# Patient Record
Sex: Male | Born: 1960 | Hispanic: Yes | Marital: Married | Smoking: Never smoker
Health system: Southern US, Community
[De-identification: ages and names within clinical notes are randomized; demographics above are authoritative.]

## PROBLEM LIST (undated history)

## (undated) ENCOUNTER — Ambulatory Visit: Admission: EM | Payer: BC Managed Care – PPO

## (undated) DIAGNOSIS — M1990 Unspecified osteoarthritis, unspecified site: Secondary | ICD-10-CM

## (undated) DIAGNOSIS — R197 Diarrhea, unspecified: Secondary | ICD-10-CM

## (undated) DIAGNOSIS — R109 Unspecified abdominal pain: Secondary | ICD-10-CM

## (undated) DIAGNOSIS — R1110 Vomiting, unspecified: Secondary | ICD-10-CM

## (undated) DIAGNOSIS — E039 Hypothyroidism, unspecified: Secondary | ICD-10-CM

## (undated) DIAGNOSIS — M25562 Pain in left knee: Secondary | ICD-10-CM

## (undated) DIAGNOSIS — K625 Hemorrhage of anus and rectum: Secondary | ICD-10-CM

## (undated) HISTORY — DX: Diarrhea, unspecified: R19.7

## (undated) HISTORY — DX: Pain in left knee: M25.562

## (undated) HISTORY — DX: Unspecified osteoarthritis, unspecified site: M19.90

## (undated) HISTORY — PX: KNEE ARTHROSCOPY: SUR90

## (undated) HISTORY — DX: Unspecified abdominal pain: R10.9

## (undated) HISTORY — DX: Hypothyroidism, unspecified: E03.9

## (undated) HISTORY — DX: Vomiting, unspecified: R11.10

## (undated) HISTORY — PX: GANGLION CYST EXCISION: SHX1691

## (undated) HISTORY — DX: Hemorrhage of anus and rectum: K62.5

---

## 2002-05-24 ENCOUNTER — Ambulatory Visit (HOSPITAL_COMMUNITY): Admission: RE | Admit: 2002-05-24 | Discharge: 2002-05-24 | Payer: Self-pay | Admitting: Otolaryngology

## 2002-05-24 ENCOUNTER — Encounter: Payer: Self-pay | Admitting: Otolaryngology

## 2003-03-02 ENCOUNTER — Ambulatory Visit (HOSPITAL_COMMUNITY): Admission: RE | Admit: 2003-03-02 | Discharge: 2003-03-02 | Payer: Self-pay | Admitting: General Surgery

## 2005-03-28 ENCOUNTER — Emergency Department (HOSPITAL_COMMUNITY): Admission: EM | Admit: 2005-03-28 | Discharge: 2005-03-28 | Payer: Self-pay | Admitting: Emergency Medicine

## 2009-07-01 ENCOUNTER — Inpatient Hospital Stay (HOSPITAL_COMMUNITY): Admission: AD | Admit: 2009-07-01 | Discharge: 2009-07-05 | Payer: Self-pay | Admitting: Family Medicine

## 2009-07-01 ENCOUNTER — Ambulatory Visit: Payer: Self-pay | Admitting: Orthopedic Surgery

## 2009-07-03 ENCOUNTER — Encounter: Payer: Self-pay | Admitting: Orthopedic Surgery

## 2009-07-04 ENCOUNTER — Encounter: Payer: Self-pay | Admitting: Orthopedic Surgery

## 2009-07-09 ENCOUNTER — Ambulatory Visit: Payer: Self-pay | Admitting: Orthopedic Surgery

## 2009-07-09 DIAGNOSIS — M009 Pyogenic arthritis, unspecified: Secondary | ICD-10-CM | POA: Insufficient documentation

## 2009-07-09 DIAGNOSIS — M25469 Effusion, unspecified knee: Secondary | ICD-10-CM

## 2009-07-10 ENCOUNTER — Encounter: Payer: Self-pay | Admitting: Orthopedic Surgery

## 2009-07-10 LAB — CONVERTED CEMR LAB: Monocyte/Macrophage: 16 % — ABNORMAL LOW (ref 50–90)

## 2009-07-11 ENCOUNTER — Ambulatory Visit: Payer: Self-pay | Admitting: Orthopedic Surgery

## 2009-07-16 ENCOUNTER — Encounter (HOSPITAL_COMMUNITY): Admission: RE | Admit: 2009-07-16 | Discharge: 2009-08-15 | Payer: Self-pay | Admitting: Orthopedic Surgery

## 2009-07-16 ENCOUNTER — Encounter: Payer: Self-pay | Admitting: Orthopedic Surgery

## 2009-07-25 ENCOUNTER — Ambulatory Visit: Payer: Self-pay | Admitting: Orthopedic Surgery

## 2009-08-09 ENCOUNTER — Encounter: Payer: Self-pay | Admitting: Orthopedic Surgery

## 2009-08-16 ENCOUNTER — Encounter (HOSPITAL_COMMUNITY): Admission: RE | Admit: 2009-08-16 | Discharge: 2009-09-15 | Payer: Self-pay | Admitting: Orthopedic Surgery

## 2009-08-28 ENCOUNTER — Encounter: Payer: Self-pay | Admitting: Orthopedic Surgery

## 2010-02-25 NOTE — Miscellaneous (Signed)
Summary: PT Discharge summary  PT Discharge summary   Imported By: Jacklynn Ganong 08/28/2009 13:21:58  _____________________________________________________________________  External Attachment:    Type:   Image     Comment:   External Document

## 2010-02-25 NOTE — Assessment & Plan Note (Signed)
Summary: 2 WK RE-CK LT KNEE/EQUITY INS/CAF   Visit Type:  Follow-up Primary Provider:  Dr. Sudie Bailey  CC:  left knee pain.  History of Present Illness: I saw Frank Carr in the office today for a 2 week  followup visit.  He is a 50 years old man with the complaint of:  left knee  Follow up after PT.  DOI 07/01/09 left knee.  better after therapy and aspiration after surgery   aspiarate negative for infection   C/O some night pain   He does not have full flexion of his knee quite yet he is about 95-100.  He has almost full extension.  He has no joint effusion no pain or tenderness at the knee area  Recommended continued therapy for 2 more weeks then he can stop  He also should continue to take his antibiotics until it is finished      Impression & Recommendations:  Problem # 1:  JOINT EFFUSION, LEFT KNEE (ICD-719.06)  Patient Instructions: 1)  take pain medictaion as needed f/u as needed  2)  finish therapy   Appended Document: 2 WK RE-CK LT KNEE/EQUITY INS/CAF

## 2010-02-25 NOTE — Letter (Signed)
Summary: Out of Work  [facility]  [address]  [place], [place] [address]   Phone: [phone]  Fax: [phone]    July 11, 2009   Employee:  Frank Carr [facility]    To Whom It May Concern:   For Medical reasons, please excuse the above named employee from work for the following dates:  Start:   June 29, 2009  End:   July 26, 2009  or until further notice  (Next scheduled appointment 07/25/09)    If you need additional information, please feel free to contact our office.         Sincerely,    Terrance Mass, MD

## 2010-02-25 NOTE — Assessment & Plan Note (Signed)
Summary: RE-CK LT KNEE/POST OP ARTHROSC/UFCW/CAF   Visit Type:  Follow-up Primary Provider:  Dr. Sudie Bailey  CC:  post op left knee.  History of Present Illness: followup visit status post arthroscopic lavage and limited debridement for present septic arthritis after a booster cone punctured the patient's LEFT knee. DOI 07/01/09 left knee.  he is currently on Augmentin 500 mg t.i.d. and I aspirated his knee post surgery because of pain  His Gram stain showed no organisms were 29,000 white cells in the fluid no crystals were seen.  Today is recheck after starting Augmentin 500mg  every 8 hrs and fluid aspiration sent for culture last visit, 2 days ago  a culture was sent and I asked them to grow her for 14 days  He said he has not taken pain pills since yesterday afternoon and his knee is much better    Physical Exam  Additional Exam:  He still using crutches to ambulate still placing minimal pressure on his LEFT knee but he did get 45 of flexion today and he has about a 5 extensor lag   Impression & Recommendations:  Problem # 1:  JOINT EFFUSION, LEFT KNEE (ICD-719.06) Assessment Improved  Orders: Physical Therapy Referral (PT) Post-Op Check ([address])  Problem # 2:  PYOGENIC ARTHRITIS, 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

## 2010-02-25 NOTE — Assessment & Plan Note (Signed)
Summary: HOSP FOL/UP/POST OP SURG 07/04/09/LT KNEE ARTH/UFCW/BSF   Visit Type:  Follow-up Primary Provider:  Dr. Sudie Bailey  CC:  left knee pain.  History of Present Illness: I saw Frank Carr in the office today for a followup visit.  He is a 50 years old man with the complaint of:  fu from hospital.  DOI 07/01/09 left knee.  Arthrosopic lavage on 07/03/09 left knee, sepsis dx.  Oral ATBS, none were given from hospital discharge  Norco 10/650 was taken, no relief, PCP Dr. Sudie Bailey called in Percocet today has not picked up yet.  No injury since surgery.  Pain since Day after surgery.  Dr. Sudie Bailey called me today and advised me that the patient was having severe pain and swelling of his LEFT knee and that his pain medications Norco 10 mg was not working  We decided to bring him in for evaluation  He does have a tense effusion in the LEFT knee and tenderness in the suprapatellar pouch.  This is the area noted at surgery to have a weblike fibrinous exudate which was resected.  We attempted aspiration of the knee were able to obtain proximally 50-60 cc of amber fluid which was nonpurulent this was sent for culture and for cell count.  The patient was not on any oral antibiotics and was started on Augmentin 500 mg q.8 hours until he is advised to stop.  I will followup with him on Thursday for reevaluation.  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 You may experience increased pain at the injection site. Apply ice pack to the area for 20 minutes every 2 hours and take 2 xtra strength tylenol every 8 hours. This increased pain will usually resolve in 24 hours. The injection will take effect in 3-10 days.   3)  check labs

## 2010-02-25 NOTE — Miscellaneous (Signed)
Summary: PT Progress note  PT Progress note   Imported By: Jacklynn Ganong 08/15/2009 10:25:25  _____________________________________________________________________  External Attachment:    Type:   Image     Comment:   External Document

## 2010-02-25 NOTE — Op Note (Signed)
Summary: Op Rpt  LT knee arthroscopy w/lim debridement  Op Rpt  LT knee arthroscopy w/lim debridement   Imported By: Cammie Sickle 07/09/2009 16:51:45  _____________________________________________________________________  External Attachment:    Type:   Image     Comment:   External Document

## 2010-02-25 NOTE — Miscellaneous (Signed)
Summary: PT clinical evaluation  PT clinical evaluation   Imported By: Jacklynn Ganong 07/22/2009 15:07:53  _____________________________________________________________________  External Attachment:    Type:   Image     Comment:   External Document

## 2010-04-14 LAB — BASIC METABOLIC PANEL
BUN: 10 mg/dL (ref 6–23)
CO2: 29 mEq/L (ref 19–32)
CO2: 31 mEq/L (ref 19–32)
Calcium: 8.5 mg/dL (ref 8.4–10.5)
Chloride: 104 mEq/L (ref 96–112)
Chloride: 107 mEq/L (ref 96–112)
Creatinine, Ser: 0.74 mg/dL (ref 0.4–1.5)
Creatinine, Ser: 0.84 mg/dL (ref 0.4–1.5)
GFR calc Af Amer: >60 mL/min (ref >60)
GFR calc Af Amer: >60 mL/min (ref >60)
GFR calc non Af Amer: >60 mL/min (ref >60)
Glucose, Bld: 116 mg/dL — ABNORMAL HIGH (ref 70–99)
Glucose, Bld: 124 mg/dL — ABNORMAL HIGH (ref 70–99)
Potassium: 3.4 mEq/L — ABNORMAL LOW (ref 3.5–5.1)
Potassium: 3.7 mEq/L (ref 3.5–5.1)
Sodium: 141 mEq/L (ref 135–145)
Sodium: 141 mEq/L (ref 135–145)

## 2010-04-14 LAB — CBC
HCT: 38.5 % — ABNORMAL LOW (ref 39.0–52.0)
HCT: 38.7 % — ABNORMAL LOW (ref 39.0–52.0)
HCT: 40.8 % (ref 39.0–52.0)
Hemoglobin: 12.9 g/dL — ABNORMAL LOW (ref 13.0–17.0)
Hemoglobin: 13.3 g/dL (ref 13.0–17.0)
Hemoglobin: 14.4 g/dL (ref 13.0–17.0)
MCHC: 34.5 g/dL (ref 30.0–36.0)
MCHC: 35.2 g/dL (ref 30.0–36.0)
MCHC: 35.4 g/dL (ref 30.0–36.0)
MCV: 89.1 fL (ref 78.0–100.0)
MCV: 90.5 fL (ref 78.0–100.0)
MCV: 90.5 fL (ref 78.0–100.0)
Platelets: 294 10*3/uL (ref 150–400)
RBC: 4.05 MIL/uL — ABNORMAL LOW (ref 4.22–5.81)
RBC: 4.26 MIL/uL (ref 4.22–5.81)
RBC: 4.58 MIL/uL (ref 4.22–5.81)
RDW: 13.1 % (ref 11.5–15.5)
RDW: 13.3 % (ref 11.5–15.5)
WBC: 12 10*3/uL — ABNORMAL HIGH (ref 4.0–10.5)
WBC: 8.5 10*3/uL (ref 4.0–10.5)

## 2010-04-14 LAB — WOUND CULTURE: Culture: NO GROWTH

## 2010-04-14 LAB — DIFFERENTIAL
Basophils Absolute: 0 10*3/uL (ref 0.0–0.1)
Basophils Absolute: 0.1 10*3/uL (ref 0.0–0.1)
Basophils Relative: 1 % (ref 0–1)
Eosinophils Absolute: 0.2 10*3/uL (ref 0.0–0.7)
Eosinophils Absolute: 0.6 10*3/uL (ref 0.0–0.7)
Eosinophils Relative: 2 % (ref 0–5)
Eosinophils Relative: 7 % — ABNORMAL HIGH (ref 0–5)
Eosinophils Relative: 9 % — ABNORMAL HIGH (ref 0–5)
Lymphocytes Relative: 17 % (ref 12–46)
Lymphocytes Relative: 9 % — ABNORMAL LOW (ref 12–46)
Lymphs Abs: 1 10*3/uL (ref 0.7–4.0)
Lymphs Abs: 1.2 10*3/uL (ref 0.7–4.0)
Monocytes Absolute: 0.4 10*3/uL (ref 0.1–1.0)
Monocytes Absolute: 0.6 10*3/uL (ref 0.1–1.0)
Monocytes Relative: 6 % (ref 3–12)
Monocytes Relative: 7 % (ref 3–12)
Neutro Abs: 4.4 10*3/uL (ref 1.7–7.7)
Neutro Abs: 6.2 10*3/uL (ref 1.7–7.7)
Neutrophils Relative %: 69 % (ref 43–77)
Neutrophils Relative %: 73 % (ref 43–77)
Neutrophils Relative %: 83 % — ABNORMAL HIGH (ref 43–77)

## 2010-04-14 LAB — URINE MICROSCOPIC-ADD ON

## 2010-04-14 LAB — COMPREHENSIVE METABOLIC PANEL
CO2: 29 mEq/L (ref 19–32)
Calcium: 8.9 mg/dL (ref 8.4–10.5)
Chloride: 99 mEq/L (ref 96–112)
Creatinine, Ser: 0.87 mg/dL (ref 0.4–1.5)
GFR calc non Af Amer: >60 mL/min (ref >60)
Glucose, Bld: 108 mg/dL — ABNORMAL HIGH (ref 70–99)
Total Bilirubin: 1.5 mg/dL — ABNORMAL HIGH (ref 0.3–1.2)

## 2010-04-14 LAB — URINALYSIS, ROUTINE W REFLEX MICROSCOPIC
Bilirubin Urine: NEGATIVE
Glucose, UA: NEGATIVE mg/dL
Specific Gravity, Urine: >1.03 — ABNORMAL HIGH (ref 1.005–1.030)
Urobilinogen, UA: 1 mg/dL (ref 0.0–1.0)
pH: 6 (ref 5.0–8.0)

## 2010-04-14 LAB — ANAEROBIC CULTURE

## 2010-06-09 DIAGNOSIS — R197 Diarrhea, unspecified: Secondary | ICD-10-CM

## 2010-06-09 DIAGNOSIS — R109 Unspecified abdominal pain: Secondary | ICD-10-CM

## 2010-06-09 DIAGNOSIS — R1110 Vomiting, unspecified: Secondary | ICD-10-CM

## 2010-06-09 DIAGNOSIS — K625 Hemorrhage of anus and rectum: Secondary | ICD-10-CM

## 2010-06-09 HISTORY — DX: Unspecified abdominal pain: R10.9

## 2010-06-09 HISTORY — DX: Hemorrhage of anus and rectum: K62.5

## 2010-06-09 HISTORY — DX: Diarrhea, unspecified: R19.7

## 2010-06-09 HISTORY — DX: Vomiting, unspecified: R11.10

## 2010-06-13 NOTE — Op Note (Signed)
NAME:  Frank Carr, Frank Carr                        ACCOUNT NO.:  [id number]   MEDICAL RECORD NO.:  [id number]                   PATIENT TYPE:  AMB   LOCATION:  DAY                                  FACILITY:  [facility]   PHYSICIAN:  Barbaraann Barthel, M.D.              DATE OF BIRTH:  1960-12-27   DATE OF PROCEDURE:  03/02/2003  DATE OF DISCHARGE:                                 OPERATIVE REPORT   PREOPERATIVE DIAGNOSIS:  Ganglion cyst dorsum of left wrist.   POSTOPERATIVE DIAGNOSIS:  Ganglion cyst dorsum of left wrist.   SURGEON:  Barbaraann Barthel, M.D.   NOTE:  This is a 50 year old [name] who had a large ganglion located  in the midportion of the dorsum of the left wrist.  He had seen Dr. Hilda Lias  where a fine needle aspiration was performed and the ganglion cyst then  recurred.  Dr. Hilda Lias had plans to take this patient to surgery; however,  the patient did not want to go back to Dr. Hilda Lias and he ended up in my  office despite my efforts to get him to return to orthopedics; he did not  wish to do so.   PROCEDURE:  Excision of ganglion, dorsum, left cyst.   SPECIMEN:  Ganglion.   TECHNIQUE:  The patient was placed in the supine position and after the  adequate administration of a Bier block anesthesia with a tourniquet placed  at 250 mmHg and after evaluating the venous circulation we made a transverse  incision over the ganglion and dissected this free the extensor tendons and  its bony insertion below the extensor tendons where the ganglion cyst was  adherent.  We then removed this ento and after  checking for hemostasis  lightly cauterized the areas around it with a needle tip cautery device and  then closed the wound subcutaneously with 5-0 Polysorb and closed with skin  with a 5-0 Nylon.  Neosporin and a sterile dressing was applied. We also  choose to give the patient a cock-up splint.   The patient was taken to the recovery room in satisfactory condition.  Prior  to  closure all sponge, needle, and instrument counts were found to be  correct.  Estimated blood loss was minimal.  No drains were placed and there  were no complications.      ___________________________________________                                            Barbaraann Barthel, M.D.   WB/MEDQ  D:  03/02/2003  T:  03/02/2003  Job:  [id number]   cc:   Barbaraann Barthel, M.D.  [address]  [place]  [place] [address]  Fax: [phone]   J. Darreld Mclean, M.D.  [phone] [address]  [place] [address]  Fax:  [phone] 

## 2010-07-21 ENCOUNTER — Ambulatory Visit (INDEPENDENT_AMBULATORY_CARE_PROVIDER_SITE_OTHER): Payer: Self-pay | Admitting: Internal Medicine

## 2010-08-25 ENCOUNTER — Encounter (INDEPENDENT_AMBULATORY_CARE_PROVIDER_SITE_OTHER): Payer: Self-pay

## 2010-09-16 ENCOUNTER — Telehealth (INDEPENDENT_AMBULATORY_CARE_PROVIDER_SITE_OTHER): Payer: Self-pay | Admitting: *Deleted

## 2010-09-16 ENCOUNTER — Ambulatory Visit (INDEPENDENT_AMBULATORY_CARE_PROVIDER_SITE_OTHER): Payer: 59 | Admitting: Internal Medicine

## 2010-09-16 ENCOUNTER — Encounter (INDEPENDENT_AMBULATORY_CARE_PROVIDER_SITE_OTHER): Payer: Self-pay | Admitting: Internal Medicine

## 2010-09-16 VITALS — BP 100/70 | HR 66 | Temp 97.9°F | Ht 60.0 in | Wt 171.3 lb

## 2010-09-16 DIAGNOSIS — K219 Gastro-esophageal reflux disease without esophagitis: Secondary | ICD-10-CM

## 2010-09-16 DIAGNOSIS — K625 Hemorrhage of anus and rectum: Secondary | ICD-10-CM

## 2010-09-16 NOTE — Telephone Encounter (Signed)
TCS sch'd 09/19/10 @ 10:00 (9:00), movi prep sample & intructions given,

## 2010-09-16 NOTE — Progress Notes (Signed)
Subjective:     Patient ID: Frank Carr, male   DOB: 09/14/1960, 50 y.o.   MRN: [id number]  HPI Frank Carr is a 50 yr old Hispanic male presenting today as a referral from Dr. Malvin Johns for rectal bleeding.  He tells me that he had rectal bleeding 4 months ago.  He says he was having bright red rectal bleeding.  It turned the water red in the commode.  Rectal bleeding lasted approximately 3 weeks.  Per Dr. Daisy Blossom notes his family also had enteritis with diarrhea but no rectal bleeding.he has had no rectal bleeding since.  He had an episode of rectal bleeding last year which resolved.   He saw Dr. Malvin Johns in May. His rectal guaiac was negative.  Denies any abdominal pain.  He does c/o of "heartburn" on a daily basis.  He occasionally has epigastric pain, which he describes as minimal. Appetite is good.  No weight loss. He has a BM about every 3 days.  Stools are red in color. No melena.  Stools are normal caliber.  Says he feels good. Denies feeling tired. Family history is negative for colon cancer.   Review of Systems see HPI     Objective:   Physical Exam Alert and oriented. Skin warm and dry. Oral mucosa is moist. Natural teeth in good condition. Sclera anicteric, conjunctivae is pink. Thyroid not enlarged. No cervical lymphadenopathy. Lungs clear. Heart regular rate and rhythm.  Abdomen is soft. Bowel sounds are positive. Stool brown, guaiac negative.  No nausea or vomiting. No diarrhea. No hepatomegaly. No abdominal masses felt. No tenderness.  No edema to lower extremities. Patient is alert and oriented.          Assessment:    Rectal bleeding.  Colonic neoplasm, polyp, AVM, hemorrhoid needs to be ruled out. GERD    Plan:   Will schedule a colonoscopy with Dr. Karilyn Cota.  Samples of Aciphex given to patient. PR in 2 weeks.     The risks and benefits such as perforation, bleeding, and infection were reviewed with the patient and is agreeable.

## 2010-09-18 ENCOUNTER — Other Ambulatory Visit (HOSPITAL_COMMUNITY): Payer: 59

## 2010-09-18 MED ORDER — SODIUM CHLORIDE 0.45 % IV SOLN
Freq: Once | INTRAVENOUS | Status: AC
  Administered 2010-09-19: 09:00:00 via INTRAVENOUS

## 2010-09-19 ENCOUNTER — Encounter (HOSPITAL_COMMUNITY): Payer: Self-pay

## 2010-09-19 ENCOUNTER — Ambulatory Visit (HOSPITAL_COMMUNITY)
Admission: RE | Admit: 2010-09-19 | Discharge: 2010-09-19 | Disposition: A | Discharge status: Home / Self Care | Payer: 59 | Source: Ambulatory Visit | Attending: Internal Medicine | Admitting: Internal Medicine

## 2010-09-19 ENCOUNTER — Encounter (HOSPITAL_COMMUNITY)
Admission: RE | Disposition: A | Discharge status: Home / Self Care | Payer: Self-pay | Source: Ambulatory Visit | Attending: Internal Medicine

## 2010-09-19 DIAGNOSIS — K644 Residual hemorrhoidal skin tags: Secondary | ICD-10-CM

## 2010-09-19 DIAGNOSIS — K625 Hemorrhage of anus and rectum: Secondary | ICD-10-CM

## 2010-09-19 DIAGNOSIS — K921 Melena: Secondary | ICD-10-CM | POA: Insufficient documentation

## 2010-09-19 HISTORY — PX: COLONOSCOPY: SHX5424

## 2010-09-19 SURGERY — COLONOSCOPY
Anesthesia: Moderate Sedation

## 2010-09-19 MED ORDER — MEPERIDINE HCL 50 MG/ML IJ SOLN
INTRAMUSCULAR | Status: AC
  Filled 2010-09-19: qty 1

## 2010-09-19 MED ORDER — MEPERIDINE HCL 50 MG/ML IJ SOLN
INTRAMUSCULAR | Status: DC | PRN
  Administered 2010-09-19 (×2): 25 mg via INTRAVENOUS

## 2010-09-19 MED ORDER — MIDAZOLAM HCL 5 MG/5ML IJ SOLN
INTRAMUSCULAR | Status: DC | PRN
  Administered 2010-09-19 (×2): 2 mg via INTRAVENOUS

## 2010-09-19 MED ORDER — MIDAZOLAM HCL 5 MG/5ML IJ SOLN
INTRAMUSCULAR | Status: AC
  Filled 2010-09-19: qty 10

## 2010-09-19 NOTE — H&P (Signed)
Frank Carr is an 50 y.o. male.   Chief Complaint: Patient is here for colonoscopy. HPI: Patient is 50 year old male who experience today episode or rectal bleeding a few months ago he recalls he had diarrhea. History of fever prior antibiotic use anorexia or weight loss. Him history is negative for colorectal carcinoma.  Past Medical History  Diagnosis Date  . Rectal bleeding 06/09/2010  . Diarrhea 06/09/2010  . Vomiting 06/09/2010  . Abdominal pain 06/09/2010  . Flank pain 06/09/2010    Past Surgical History  Procedure Date  . Ganglion cyst excision     left wrist  . Knee arthroscopy     left knee    Family History  Problem Relation Age of Onset  . Anesthesia problems Neg Hx   . Hypotension Neg Hx   . Malignant hyperthermia Neg Hx   . Pseudochol deficiency Neg Hx    Social History:  reports that he has never smoked. He does not have any smokeless tobacco history on file. He reports that he drinks alcohol. He reports that he does not use illicit drugs.  Allergies: No Known Allergies  Medications Prior to Admission  Medication Dose Route Frequency Provider Last Rate Last Dose  . 0.45 % sodium chloride infusion   Intravenous Once Malissa Hippo, MD 20 mL/hr at 09/19/10 0926    . meperidine (DEMEROL) 50 MG/ML injection           . midazolam (VERSED) 5 MG/5ML injection            Medications Prior to Admission  Medication Sig Dispense Refill  . naproxen (NAPROSYN) 500 MG tablet Take 500 mg by mouth 2 (two) times daily with a meal.          No results found for this or any previous visit (from the past 48 hour(s)). No results found.  Review of Systems  Constitutional: Negative for weight loss.    Blood pressure 121/81, pulse 60, temperature 98.2 F (36.8 C), temperature source Oral, resp. rate 22, SpO2 97.00%. Physical Exam  Constitutional: He appears well-developed and well-nourished.  HENT:  Mouth/Throat: Oropharynx is clear and moist.  Eyes: Conjunctivae  are normal. No scleral icterus.  Neck: No thyromegaly present.  Cardiovascular: Normal rate, regular rhythm and normal heart sounds.   No murmur heard. Respiratory: Breath sounds normal.  GI: Soft. He exhibits no mass. There is Tenderness: mild tenderness at llq..  Musculoskeletal: He exhibits no edema.  Lymphadenopathy:    He has no cervical adenopathy.  Neurological: He is alert.  Skin: Skin is warm and dry.     Assessment/Plan Rectal bleeding. Diagnostic colonoscopy.  Cleopatra Sardo U 09/19/2010, 10:06 AM

## 2010-09-19 NOTE — Op Note (Signed)
COLONOSCOPY PROCEDURE REPORT  PATIENT:  Frank Carr  MR#:  [id number] Birthdate:  13-Sep-1960, 50 y.o., male Endoscopist:  Dr. Malissa Hippo, MD Referred By:  Dr. Barbaraann Barthel, MD Procedure Date: 09/19/2010  Procedure:   Colonoscopy  Indications:  Rectal bleeding; he had 2 day episode would 12 weeks ago.  Informed Consent: Procedure and risks were reviewed with the patient questions were answered and informed consent was obtained. Medications:  Demerol 50 mg IV Versed 4 mg IV  Description of procedure:  After a digital rectal exam was performed, that colonoscope was advanced from the anus through the rectum and colon to the area of the cecum, ileocecal valve and appendiceal orifice. The cecum was deeply intubated. These structures were well-seen and photographed for the record. From the level of the cecum and ileocecal valve, the scope was slowly and cautiously withdrawn. The mucosal surfaces were carefully surveyed utilizing scope tip to flexion to facilitate fold flattening as needed. The scope was pulled down into the rectum where a thorough exam including retroflexion was performed.  Findings:   Prep was fair with formed stool data throat the colon but mainly in a sending colon and cecum Normal mucosa throughout. Small external hemorrhoids.  Therapeutic/Diagnostic Maneuvers Performed:  None  Complications:  None  Cecal Withdrawal Time:  11 minutes  Impression:  External hemorrhoids otherwise normal colonoscopy  somewhat limited by quality of prep.  Recommendations:  Standard instructions given He will call us if bleeding recurs.  [name] U  09/19/2010 10:33 AM  CC: Dr. Barbaraann Barthel, MD

## 2010-09-26 ENCOUNTER — Encounter (HOSPITAL_COMMUNITY): Payer: Self-pay | Admitting: Internal Medicine

## 2011-05-07 ENCOUNTER — Other Ambulatory Visit: Payer: Self-pay | Admitting: Neurology

## 2011-05-07 DIAGNOSIS — R200 Anesthesia of skin: Secondary | ICD-10-CM

## 2011-05-13 ENCOUNTER — Ambulatory Visit (HOSPITAL_COMMUNITY): Admission: RE | Admit: 2011-05-13 | Payer: 59 | Source: Ambulatory Visit

## 2011-06-04 ENCOUNTER — Other Ambulatory Visit: Payer: Self-pay

## 2011-06-04 DIAGNOSIS — G4700 Insomnia, unspecified: Secondary | ICD-10-CM

## 2011-06-12 ENCOUNTER — Ambulatory Visit: Payer: 59 | Attending: Neurology | Admitting: Sleep Medicine

## 2011-06-12 DIAGNOSIS — Z6830 Body mass index (BMI) 30.0-30.9, adult: Secondary | ICD-10-CM | POA: Insufficient documentation

## 2011-06-12 DIAGNOSIS — G4730 Sleep apnea, unspecified: Secondary | ICD-10-CM

## 2011-06-12 DIAGNOSIS — G4733 Obstructive sleep apnea (adult) (pediatric): Secondary | ICD-10-CM | POA: Insufficient documentation

## 2011-06-14 NOTE — Procedures (Signed)
NAME:  Frank Carr, Frank Carr              ACCOUNT NO.:  [id number]  MEDICAL RECORD NO.:  [id number]          PATIENT TYPE:  OUT  LOCATION:  SLEEP LAB                     FACILITY:  [facility]  PHYSICIAN:  Shamere Campas A. Gerilyn Pilgrim, M.D. DATE OF BIRTH:  05/18/1960  DATE OF STUDY:  06/12/2011                           NOCTURNAL POLYSOMNOGRAM  REFERRING PHYSICIAN:  Carol Loftin A. Gerilyn Pilgrim, M.D.  INDICATION:  This is a 51 year old man, who presents with fatigue, hypersomnia, and snoring.  The study is being done to evaluate for obstructive sleep apnea syndrome.  MEDICATIONS:  Potassium, aspirin, Tylenol.  EPWORTH SLEEPINESS SCALE:  7.  BMI:  30.  ARCHITECTURAL SUMMARY:  The total recording time is 431 minutes.  Sleep efficiency 83%.  Sleep latency 6.5 minutes.  REM latency 135 minutes. Stage N1 8%, N2 54%, N3 17%, and REM sleep 20%.  RESPIRATORY SUMMARY:  Baseline oxygen saturation is 96.  Lowest saturation is 76 during REM sleep.  Diagnostic AHI is 51.  The REM index is 62.  LIMB MOVEMENT SUMMARY:  PLM index 3.  ELECTROCARDIOGRAM SUMMARY:  Average heart rate is 64 with no significant dysrhythmias observed.  IMPRESSION:  Severe obstructive sleep apnea syndrome.    Auther Lyerly A. Gerilyn Pilgrim, M.D.    KAD/MEDQ  D:  06/13/2011 17:28:18  T:  06/13/2011 23:51:20  Job:  [id number]

## 2011-07-20 ENCOUNTER — Other Ambulatory Visit: Payer: Self-pay | Admitting: General Surgery

## 2012-03-17 NOTE — H&P (Signed)
  NTS SOAP Note  Vital Signs:  Vitals as of: 03/17/2012: Systolic 124: Diastolic 80: Heart Rate 64: Temp 97.65F: Height 18ft 0in: Weight 178Lbs 0 Ounces: BMI 35  BMI : 34.76 kg/m2  Subjective: This 52 Years 2 Months old Male presents for screening TCS.  Denies any gi complaints.  No family h/o colon cancer.  Never has had a TCS.   Review of Symptoms:  Constitutional:unremarkable   Head:unremarkable    Eyes:unremarkable   Nose/Mouth/Throat:unremarkable Cardiovascular:  unremarkable   Respiratory:unremarkable   Gastrointestinal:  unremarkable   Genitourinary:unremarkable     Musculoskeletal:unremarkable   Skin:unremarkable Hematolgic/Lymphatic:unremarkable     Allergic/Immunologic:unremarkable     Past Medical History:    Reviewed   Past Medical History  Surgical History: none Medical Problems: none Allergies: nkda Medications: none   Social History:Reviewed  Social History  Preferred Language: Spanish; Castilian Race:  Other Ethnicity: Hispanic / Latino Age: 52 Years 2 Months Marital Status:  M Alcohol:  No Recreational drug(s):  No   Smoking Status: Never smoker reviewed on 03/17/2012 Functional Status reviewed on mm/dd/yyyy ------------------------------------------------ Bathing: Normal Cooking: Normal Dressing: Normal Driving: Normal Eating: Normal Managing Meds: Normal Oral Care: Normal Shopping: Normal Toileting: Normal Transferring: Normal Walking: Normal Cognitive Status reviewed on mm/dd/yyyy ------------------------------------------------ Attention: Normal Decision Making: Normal Language: Normal Memory: Normal Motor: Normal Perception: Normal Problem Solving: Normal Visual and Spatial: Normal   Family History:  Reviewed   Family History  Is there a family history of:No family h/o colon cancer    Objective Information: General:  Well appearing, well nourished in no  distress. Neck:  Supple without lymphadenopathy.  Heart:  RRR, no murmur Lungs:    CTA bilaterally, no wheezes, rhonchi, rales.  Breathing unlabored. Abdomen:Soft, NT/ND, no HSM, no masses.   deferred to procedure  Assessment:Need for screening TCS  Diagnosis &amp; Procedure:    Plan:Scheduled for screening TCS on 04/05/12.   Patient Education:Alternative treatments to surgery were discussed with patient (and family).  Risks and benefits  of procedure including bleeding and perforation were fully explained to the patient (and family) who gave informed consent. Patient/family questions were addressed.  Follow-up:Pending Surgery

## 2012-04-05 ENCOUNTER — Encounter (HOSPITAL_COMMUNITY): Payer: Self-pay | Admitting: *Deleted

## 2012-04-05 ENCOUNTER — Encounter (HOSPITAL_COMMUNITY)
Admission: RE | Disposition: A | Discharge status: Home / Self Care | Payer: Self-pay | Source: Ambulatory Visit | Attending: General Surgery

## 2012-04-05 ENCOUNTER — Ambulatory Visit (HOSPITAL_COMMUNITY)
Admission: RE | Admit: 2012-04-05 | Discharge: 2012-04-05 | Disposition: A | Discharge status: Home / Self Care | Payer: 59 | Source: Ambulatory Visit | Attending: General Surgery | Admitting: General Surgery

## 2012-04-05 DIAGNOSIS — Z1211 Encounter for screening for malignant neoplasm of colon: Secondary | ICD-10-CM | POA: Insufficient documentation

## 2012-04-05 HISTORY — PX: COLONOSCOPY: SHX5424

## 2012-04-05 SURGERY — COLONOSCOPY
Anesthesia: Moderate Sedation

## 2012-04-05 MED ORDER — MEPERIDINE HCL 100 MG/ML IJ SOLN
INTRAMUSCULAR | Status: AC
  Filled 2012-04-05: qty 1

## 2012-04-05 MED ORDER — MIDAZOLAM HCL 5 MG/5ML IJ SOLN
INTRAMUSCULAR | Status: DC | PRN
  Administered 2012-04-05: 3 mg via INTRAVENOUS

## 2012-04-05 MED ORDER — MEPERIDINE HCL 25 MG/ML IJ SOLN
INTRAMUSCULAR | Status: DC | PRN
  Administered 2012-04-05: 50 mg via INTRAVENOUS

## 2012-04-05 MED ORDER — SODIUM CHLORIDE 0.45 % IV SOLN
INTRAVENOUS | Status: DC
  Administered 2012-04-05: 20 mL/h via INTRAVENOUS

## 2012-04-05 MED ORDER — SODIUM CHLORIDE 0.9 % IV SOLN: INTRAVENOUS | Status: DC

## 2012-04-05 MED ORDER — MIDAZOLAM HCL 5 MG/5ML IJ SOLN
INTRAMUSCULAR | Status: AC
  Filled 2012-04-05: qty 5

## 2012-04-05 NOTE — Interval H&P Note (Signed)
History and Physical Interval Note:  04/05/2012 9:09 AM  Frank Carr  has presented today for surgery, with the diagnosis of screening  The various methods of treatment have been discussed with the patient and family. After consideration of risks, benefits and other options for treatment, the patient has consented to  Procedure(s): COLONOSCOPY (N/A) as a surgical intervention .  The patient's history has been reviewed, patient examined, no change in status, stable for surgery.  I have reviewed the patient's chart and labs.  Questions were answered to the patient's satisfaction.     [name]

## 2012-04-05 NOTE — Op Note (Signed)
[facility] [address] [place], [address]   COLONOSCOPY PROCEDURE REPORT  PATIENT: Frank, Carr  MR#: [id number] BIRTHDATE: 08-17-1960 , 51  yrs. old GENDER: Male ENDOSCOPIST: Franky Macho, MD REFERRED BY: PROCEDURE DATE:  04/05/2012 PROCEDURE:   Colonoscopy, screening ASA CLASS:   Class I INDICATIONS:Average risk patient for colon cancer. MEDICATIONS: Versed 3 mg IV and Demerol 50 mg IV  DESCRIPTION OF PROCEDURE:   After the risks benefits and alternatives of the procedure were thoroughly explained, informed consent was obtained.  A digital rectal exam revealed no abnormalities of the rectum.   The [id number] ([id number])  endoscope was introduced through the anus and advanced to the cecum, which was identified by both the appendix and ileocecal valve. No adverse events experienced.   The quality of the prep was adequate, using MoviPrep  The instrument was then slowly withdrawn as the colon was fully examined.      COLON FINDINGS: A normal appearing cecum, ileocecal valve, and appendiceal orifice were identified.  The ascending, hepatic flexure, transverse, splenic flexure, descending, sigmoid colon and rectum appeared unremarkable.  No polyps or cancers were seen. Retroflexed views revealed no abnormalities. The time to cecum=4 minutes 0 seconds.  Withdrawal time=2 minutes 0 seconds.  The scope was withdrawn and the procedure completed. COMPLICATIONS: There were no complications.  ENDOSCOPIC IMPRESSION: Normal colon  RECOMMENDATIONS: Repeat Colonscopy in 10 years.   eSigned:  Franky Macho, MD 04/05/2012 9:21 AM   cc:

## 2012-04-11 ENCOUNTER — Encounter (HOSPITAL_COMMUNITY): Payer: Self-pay | Admitting: General Surgery

## 2012-06-01 ENCOUNTER — Other Ambulatory Visit (HOSPITAL_COMMUNITY): Payer: Self-pay | Admitting: General Surgery

## 2012-06-01 DIAGNOSIS — M542 Cervicalgia: Secondary | ICD-10-CM

## 2012-06-03 ENCOUNTER — Ambulatory Visit (HOSPITAL_COMMUNITY): Payer: 59

## 2012-06-06 ENCOUNTER — Other Ambulatory Visit (HOSPITAL_COMMUNITY): Payer: Self-pay | Admitting: General Surgery

## 2012-06-06 DIAGNOSIS — M542 Cervicalgia: Secondary | ICD-10-CM

## 2012-06-09 ENCOUNTER — Ambulatory Visit (HOSPITAL_COMMUNITY)
Admission: RE | Admit: 2012-06-09 | Discharge: 2012-06-09 | Disposition: A | Discharge status: Home / Self Care | Payer: 59 | Source: Ambulatory Visit | Attending: General Surgery | Admitting: General Surgery

## 2012-06-09 DIAGNOSIS — M542 Cervicalgia: Secondary | ICD-10-CM

## 2012-06-09 DIAGNOSIS — M47812 Spondylosis without myelopathy or radiculopathy, cervical region: Secondary | ICD-10-CM | POA: Insufficient documentation

## 2012-06-09 DIAGNOSIS — M5020 Other cervical disc displacement, unspecified cervical region: Secondary | ICD-10-CM | POA: Insufficient documentation

## 2013-05-03 ENCOUNTER — Other Ambulatory Visit (HOSPITAL_COMMUNITY): Payer: Self-pay | Admitting: General Surgery

## 2013-05-03 DIAGNOSIS — M545 Low back pain, unspecified: Secondary | ICD-10-CM

## 2013-05-05 ENCOUNTER — Ambulatory Visit (HOSPITAL_COMMUNITY): Payer: 59

## 2014-05-28 ENCOUNTER — Encounter (INDEPENDENT_AMBULATORY_CARE_PROVIDER_SITE_OTHER): Payer: Self-pay | Admitting: Internal Medicine

## 2014-05-28 ENCOUNTER — Other Ambulatory Visit (INDEPENDENT_AMBULATORY_CARE_PROVIDER_SITE_OTHER): Payer: Self-pay | Admitting: *Deleted

## 2014-05-28 ENCOUNTER — Ambulatory Visit (INDEPENDENT_AMBULATORY_CARE_PROVIDER_SITE_OTHER): Payer: BLUE CROSS/BLUE SHIELD | Admitting: Internal Medicine

## 2014-05-28 ENCOUNTER — Encounter (INDEPENDENT_AMBULATORY_CARE_PROVIDER_SITE_OTHER): Payer: Self-pay | Admitting: *Deleted

## 2014-05-28 VITALS — BP 110/84 | HR 80 | Temp 97.9°F | Ht 60.0 in | Wt 170.5 lb

## 2014-05-28 DIAGNOSIS — R1314 Dysphagia, pharyngoesophageal phase: Secondary | ICD-10-CM | POA: Diagnosis not present

## 2014-05-28 DIAGNOSIS — K219 Gastro-esophageal reflux disease without esophagitis: Secondary | ICD-10-CM

## 2014-05-28 DIAGNOSIS — R1310 Dysphagia, unspecified: Secondary | ICD-10-CM

## 2014-05-28 MED ORDER — OMEPRAZOLE 40 MG PO CPDR: 40.0000 mg | DELAYED_RELEASE_CAPSULE | Freq: Every day | ORAL | Status: DC

## 2014-05-28 NOTE — Progress Notes (Signed)
   Subjective:    Patient ID: Frank Carr, male    DOB: 1960/04/10, 54 y.o.   MRN: [id number]  HPI Referred to our office by Dr. Sudie BaileyKnowlton for dysphagia. He was last seen in 2012 for rectal bleeding and underwent a colonoscopy.  He says sometimes when he eats, he foods will lodge. This is occurring every day. Meats and breads lodge in his esophagus. Symptoms x 2-3 months. Appetite is good.  No weight loss. Denies any indigestion. He usually has a BM daily. No melena or BRRB. No abdominal pain.   04/05/2013 Colonoscopy Screening, Dr. Lovell SheehanJenkins: normal.   09/19/2010 Colonoscopy: Dr. Karilyn Cotaehman: rectal bleeding Impression:  External hemorrhoids otherwise normal colonoscopy somewhat limited by quality of prep. Review of Systems Works at [employer]. Four children in good health. Married.     Past Medical History  Diagnosis Date  . Rectal bleeding 06/09/2010  . Diarrhea 06/09/2010  . Vomiting 06/09/2010  . Abdominal pain 06/09/2010  . Flank pain 06/09/2010  . Arthritis     left knee    Past Surgical History  Procedure Laterality Date  . Ganglion cyst excision      left wrist  . Knee arthroscopy      left knee  . Colonoscopy  09/19/2010    Procedure: COLONOSCOPY;  Surgeon: Malissa HippoNajeeb U Rehman, MD;  Location: [facility];  Service: Endoscopy;  Laterality: N/A;  10:00  . Colonoscopy N/A 04/05/2012    Procedure: COLONOSCOPY;  Surgeon: Dalia HeadingMark A Jenkins, MD;  Location: [facility];  Service: Gastroenterology;  Laterality: N/A;    No Known Allergies  Current Outpatient Prescriptions on File Prior to Visit  Medication Sig Dispense Refill  . naproxen (NAPROSYN) 500 MG tablet Take 500 mg by mouth 2 (two) times daily as needed.      No current facility-administered medications on file prior to visit.          Objective:   Physical Exam Blood pressure 110/84, pulse 80, temperature 97.9 F (36.6 C), height 5' (1.524 m), weight 170 lb 8 oz (77.338 kg).   Alert and oriented. Skin  warm and dry. Oral mucosa is moist.   . Sclera anicteric, conjunctivae is pink. Thyroid not enlarged. No cervical lymphadenopathy. Lungs clear. Heart regular rate and rhythm.  Abdomen is soft. Bowel sounds are positive. No hepatomegaly. No abdominal masses felt. No tenderness.  No edema to lower extremities.      Assessment & Plan:  Dysphagia. Stricture needs to be ruled out. EGD/ED/Rx for Omeprazole 40mg  30 minutes before breakfast. The risks and benefits such as perforation, bleeding, and infection were reviewed with the patient and is agreeable.

## 2014-05-28 NOTE — Patient Instructions (Addendum)
EGD/ED. The risks and benefits such as perforation, bleeding, and infection were reviewed with the patient and is agreeable. 

## 2014-06-27 ENCOUNTER — Encounter (HOSPITAL_COMMUNITY): Payer: Self-pay | Admitting: *Deleted

## 2014-06-27 ENCOUNTER — Ambulatory Visit (HOSPITAL_COMMUNITY)
Admission: RE | Admit: 2014-06-27 | Discharge: 2014-06-27 | Disposition: A | Discharge status: Home / Self Care | Payer: BLUE CROSS/BLUE SHIELD | Source: Ambulatory Visit | Attending: Internal Medicine | Admitting: Internal Medicine

## 2014-06-27 ENCOUNTER — Encounter (HOSPITAL_COMMUNITY)
Admission: RE | Disposition: A | Discharge status: Home / Self Care | Payer: Self-pay | Source: Ambulatory Visit | Attending: Internal Medicine

## 2014-06-27 DIAGNOSIS — K449 Diaphragmatic hernia without obstruction or gangrene: Secondary | ICD-10-CM

## 2014-06-27 DIAGNOSIS — K219 Gastro-esophageal reflux disease without esophagitis: Secondary | ICD-10-CM | POA: Diagnosis not present

## 2014-06-27 DIAGNOSIS — K2950 Unspecified chronic gastritis without bleeding: Secondary | ICD-10-CM | POA: Diagnosis not present

## 2014-06-27 DIAGNOSIS — R1310 Dysphagia, unspecified: Secondary | ICD-10-CM | POA: Diagnosis not present

## 2014-06-27 DIAGNOSIS — K228 Other specified diseases of esophagus: Secondary | ICD-10-CM | POA: Diagnosis not present

## 2014-06-27 DIAGNOSIS — M13862 Other specified arthritis, left knee: Secondary | ICD-10-CM | POA: Diagnosis not present

## 2014-06-27 DIAGNOSIS — K2960 Other gastritis without bleeding: Secondary | ICD-10-CM | POA: Diagnosis not present

## 2014-06-27 HISTORY — PX: ESOPHAGEAL DILATION: SHX303

## 2014-06-27 HISTORY — PX: ESOPHAGOGASTRODUODENOSCOPY: SHX5428

## 2014-06-27 SURGERY — EGD (ESOPHAGOGASTRODUODENOSCOPY)
Anesthesia: Moderate Sedation

## 2014-06-27 MED ORDER — STERILE WATER FOR IRRIGATION IR SOLN
Status: DC | PRN
  Administered 2014-06-27: 13:00:00

## 2014-06-27 MED ORDER — MEPERIDINE HCL 50 MG/ML IJ SOLN
INTRAMUSCULAR | Status: AC
  Filled 2014-06-27: qty 1

## 2014-06-27 MED ORDER — MIDAZOLAM HCL 5 MG/5ML IJ SOLN
INTRAMUSCULAR | Status: DC | PRN
  Administered 2014-06-27: 2 mg via INTRAVENOUS
  Administered 2014-06-27: 1 mg via INTRAVENOUS
  Administered 2014-06-27: 2 mg via INTRAVENOUS

## 2014-06-27 MED ORDER — MIDAZOLAM HCL 5 MG/5ML IJ SOLN
INTRAMUSCULAR | Status: AC
  Filled 2014-06-27: qty 10

## 2014-06-27 MED ORDER — SODIUM CHLORIDE 0.9 % IV SOLN
INTRAVENOUS | Status: DC
  Administered 2014-06-27: 1000 mL via INTRAVENOUS

## 2014-06-27 MED ORDER — MEPERIDINE HCL 50 MG/ML IJ SOLN
INTRAMUSCULAR | Status: DC | PRN
  Administered 2014-06-27 (×2): 25 mg via INTRAVENOUS

## 2014-06-27 NOTE — Discharge Instructions (Signed)
Resume usual medications and diet. Anti-reflux measures. No driving for 24 hours. Physician will call with results of biopsy and blood tests.  Gastrointestinal Endoscopy, Care After Refer to this sheet in the next few weeks. These instructions provide you with information on caring for yourself after your procedure. Your caregiver may also give you more specific instructions. Your treatment has been planned according to current medical practices, but problems sometimes occur. Call your caregiver if you have any problems or questions after your procedure. HOME CARE INSTRUCTIONS  If you were given medicine to help you relax (sedative), do not drive, operate machinery, or sign important documents for 24 hours.  Avoid alcohol and hot or warm beverages for the first 24 hours after the procedure.  Only take over-the-counter or prescription medicines for pain, discomfort, or fever as directed by your caregiver. You may resume taking your normal medicines unless your caregiver tells you otherwise. Ask your caregiver when you may resume taking medicines that may cause bleeding, such as aspirin, clopidogrel, or warfarin.  You may return to your normal diet and activities on the day after your procedure, or as directed by your caregiver. Walking may help to reduce any bloated feeling in your abdomen.  Drink enough fluids to keep your urine clear or pale yellow.  You may gargle with salt water if you have a sore throat. SEEK IMMEDIATE MEDICAL CARE IF:  You have severe nausea or vomiting.  You have severe abdominal pain, abdominal cramps that last longer than 6 hours, or abdominal swelling (distention).  You have severe shoulder or back pain.  You have trouble swallowing.  You have shortness of breath, your breathing is shallow, or you are breathing faster than normal.  You have a fever or a rapid heartbeat.  You vomit blood or material that looks like coffee grounds.  You have bloody, black,  or tarry stools. MAKE SURE YOU:  Understand these instructions.  Will watch your condition.  Will get help right away if you are not doing well or get worse. Document Released: 08/27/2003 Document Revised: 05/29/2013 Document Reviewed: 04/14/2011 [facility] Patient Information 2015 [place], [place]. This information is not intended to replace advice given to you by your health care provider. Make sure you discuss any questions you have with your health care provider.   Gastroesophageal Reflux Disease, Adult Gastroesophageal reflux disease (GERD) happens when acid from your stomach flows up into the esophagus. When acid comes in contact with the esophagus, the acid causes soreness (inflammation) in the esophagus. Over time, GERD may create small holes (ulcers) in the lining of the esophagus. CAUSES   Increased body weight. This puts pressure on the stomach, making acid rise from the stomach into the esophagus.  Smoking. This increases acid production in the stomach.  Drinking alcohol. This causes decreased pressure in the lower esophageal sphincter (valve or ring of muscle between the esophagus and stomach), allowing acid from the stomach into the esophagus.  Late evening meals and a full stomach. This increases pressure and acid production in the stomach.  A malformed lower esophageal sphincter. Sometimes, no cause is found. SYMPTOMS   Burning pain in the lower part of the mid-chest behind the breastbone and in the mid-stomach area. This may occur twice a week or more often.  Trouble swallowing.  Sore throat.  Dry cough.  Asthma-like symptoms including chest tightness, shortness of breath, or wheezing. DIAGNOSIS  Your caregiver may be able to diagnose GERD based on your symptoms. In some cases, X-rays  and other tests may be done to check for complications or to check the condition of your stomach and esophagus. TREATMENT  Your caregiver may recommend over-the-counter or  prescription medicines to help decrease acid production. Ask your caregiver before starting or adding any new medicines.  HOME CARE INSTRUCTIONS   Change the factors that you can control. Ask your caregiver for guidance concerning weight loss, quitting smoking, and alcohol consumption.  Avoid foods and drinks that make your symptoms worse, such as:  Caffeine or alcoholic drinks.  Chocolate.  Peppermint or mint flavorings.  Garlic and onions.  Spicy foods.  Citrus fruits, such as oranges, lemons, or limes.  Tomato-based foods such as sauce, chili, salsa, and pizza.  Fried and fatty foods.  Avoid lying down for the 3 hours prior to your bedtime or prior to taking a nap.  Eat small, frequent meals instead of large meals.  Wear loose-fitting clothing. Do not wear anything tight around your waist that causes pressure on your stomach.  Raise the head of your bed 6 to 8 inches with wood blocks to help you sleep. Extra pillows will not help.  Only take over-the-counter or prescription medicines for pain, discomfort, or fever as directed by your caregiver.  Do not take aspirin, ibuprofen, or other nonsteroidal anti-inflammatory drugs (NSAIDs). SEEK IMMEDIATE MEDICAL CARE IF:   You have pain in your arms, neck, jaw, teeth, or back.  Your pain increases or changes in intensity or duration.  You develop nausea, vomiting, or sweating (diaphoresis).  You develop shortness of breath, or you faint.  Your vomit is green, yellow, black, or looks like coffee grounds or blood.  Your stool is red, bloody, or black. These symptoms could be signs of other problems, such as heart disease, gastric bleeding, or esophageal bleeding. MAKE SURE YOU:   Understand these instructions.  Will watch your condition.  Will get help right away if you are not doing well or get worse. Document Released: 10/22/2004 Document Revised: 04/06/2011 Document Reviewed: 08/01/2010 [facility] Patient  Information 2015 [place], [place]. This information is not intended to replace advice given to you by your health care provider. Make sure you discuss any questions you have with your health care provider.  Opciones de alimentos para pacientes con reflujo gastroesofgico (Food Choices for Gastroesophageal Reflux Disease) Cuando se tiene reflujo gastroesofgico (ERGE), los alimentos que se ingieren y los hbitos de alimentacin son muy importantes. Elegir los alimentos adecuados puede ayudar a Altria Groupaliviar las molestias.  QU PAUTAS DEBO SEGUIR?   Elija las frutas, los vegetales, los cereales integrales y los productos lcteos con bajo contenido de [place]gr[place].  Elija las carnes de [place], de pescado y de ave con bajo contenido de grasas.  Limite las grasas, [address]como los [place]ites, los aderezos para [place], la [place], los frutos secos y Programme researcher, broadcasting/film/videoel aguacate.  Lleve un registro de alimentos. Esto ayuda a identificar los alimentos que ocasionan sntomas.  Evite los alimentos que le ocasionen sntomas. Pueden ser distintos para cada persona.  Haga comidas pequeas durante Glass blower/designerel da en lugar de 3 comidas abundantes.  Coma lentamente, en un lugar donde est distendido.  Limite el consumo de alimentos fritos.  Cocine los alimentos utilizando mtodos que no sean la fritura.  Evite el consumo alcohol.  Evite beber grandes cantidades de lquidos con las comidas.  Evite agacharse o recostarse hasta despus de 2 o 3horas de haber comido. QU ALIMENTOS NO SE RECOMIENDAN?  Estos son algunos alimentos y bebidas que pueden empeorar los sntomas: Duke EnergyVegetales  Tomates. Jugo de tomate. Salsa de tomate y espagueti. Ajes. Cebolla y [place]. Rbano picante. Frutas Naranjas, pomelos y limn (fruta y [place]). Carnes Carnes de [place], de pescado y de ave con gran contenido de grasas. Esto incluye los perros calientes, las [place], el Pyote, la salchicha, el salame y el tocino. Lcteos Leche entera y [place]. [employer].  Crema. Mantequilla. Helados. Queso crema.  Bebidas T o caf. Bebidas gaseosas o bebidas energizantes. Condimentos Salsa picante. Salsa barbacoa.  Dulces/postres Chocolate y cacao. Rosquillas. Menta y mentol. Grasas y [employer]. Esto incluye las papas fritas. Otros Vinagre. Especias picantes. Esto incluye la pimienta negra, la pimienta blanca, la pimienta roja, la pimienta de cayena, el curry en polvo, los clavos de [place], el jengibre y el [place] en polvo. Esta no es [employer] de los alimentos y las bebidas que se Theatre stage manager. Comunquese con el nutricionista para recibir ms informacin. Document Released: 07/14/2011 Document Revised: 01/17/2013 [facility] Patient Information 2015 [place], [place]. This information is not intended to replace advice given to you by your health care provider. Make sure you discuss any questions you have with your health care provider.

## 2014-06-27 NOTE — Op Note (Signed)
EGD PROCEDURE REPORT  PATIENT:  Frank KiltsJavier O Carr  MR#:  [id number] Birthdate:  1960-08-18, 54 y.o., male Endoscopist:  Dr. Malissa HippoNajeeb U. Rehman, MD Referred By:  Dr. Milana ObeyStephen D Knowlton, MD  Procedure Date: 06/27/2014  Procedure:   EGD with ED  Indications:  Patient is 54 year old Hispanic male who presents with solid food dysphagia. Also gives history of frequent heartburn which is well controlled with omeprazole which was started few weeks ago.            Informed Consent:  The risks, benefits, alternatives & imponderables which include, but are not limited to, bleeding, infection, perforation, drug reaction and potential missed lesion have been reviewed.  The potential for biopsy, lesion removal, esophageal dilation, etc. have also been discussed.  Questions have been answered.  All parties agreeable.  Please see history & physical in medical record for more information.  Medications:  Demerol 50 mg IV Versed 5 mg IV Cetacaine spray topically for oropharyngeal anesthesia  Description of procedure:  The endoscope was introduced through the mouth and advanced to the second portion of the duodenum without difficulty or limitations. The mucosal surfaces were surveyed very carefully during advancement of the scope and upon withdrawal.  Findings:  Esophagus:  Mucosa of the proximal and middle segment was normal. Salmon colored mucosa noted in the distal esophagus with known circumferential pattern in maximal length 15 mm. GEJ:  33 cm Hiatus:  36 cm Stomach:  Stomach was empty and distended very well with insufflation. Folds in the proximal stomach were normal. Examination of mucosa at gastric body was normal. Scattered antral erosions noted. Pyloric channel was patent. Angularis fundus and cardia were examined by retroflexion of scope and were normal. Duodenum:  Normal bulbar and post bulbar mucosa.  Therapeutic/Diagnostic Maneuvers Performed:   Esophagus was dilated by passing 54 French Maloney  dilator to full insertion but no mucosal disruption induced. Multiple biopsies were taken from distal esophagus to confirm diagnosis of Barrett's esophagus.  Complications:  None  Impression: Salmon colored mucosa involving distal segment of esophagus and non-circumferential pattern in maximal length of 15 mm. Biopsies taken post dilation. Small sliding hiatal hernia. Erosive antral gastritis. Esophagus dilated by passing 54 French Maloney dilator but no mucosal disruption induced.  Recommendations:  Anti-reflux measures. Continue omeprazole at 40 mg by mouth every morning. H. pylori serology. I will be contacting patient biopsy results and further recommendations.  REHMAN,NAJEEB U  06/27/2014  1:55 PM  CC: Dr. Milana ObeyKNOWLTON,STEPHEN D, MD & Dr. Bonnetta BarryNo ref. provider found

## 2014-06-27 NOTE — H&P (Signed)
Frank Carr is an 54 y.o. male.   Chief Complaint: Patient is here for EGD and ED. HPI: Patient is 54 year old Hispanic male who presents with few months history of dysphagia to solids. Frank Carr points to suprasternal area soft bolus obstruction. Frank Carr says when this occurs he is able to wash food down with water or liquids. He has not experienced any regurgitation to get relief. He was having heartburn 3-4 times a week. Since he has been on omeprazole he is not having heartburn anymore. He has good appetite. He denies weight loss. The fact he has gained a few pounds this year. He denies melena. He is a Naprosyn when necessary for arthritis.  Past Medical History  Diagnosis Date  . Rectal bleeding 06/09/2010  . Diarrhea 06/09/2010  . Vomiting 06/09/2010  . Abdominal pain 06/09/2010  . Flank pain 06/09/2010  . Arthritis     left knee    Past Surgical History  Procedure Laterality Date  . Ganglion cyst excision      left wrist  . Knee arthroscopy      left knee  . Colonoscopy  09/19/2010    Procedure: COLONOSCOPY;  Surgeon: Malissa HippoNajeeb U Kateryn Marasigan, MD;  [facility];  Service: Endoscopy;  Laterality: N/A;  10:00  . Colonoscopy N/A 04/05/2012    Procedure: COLONOSCOPY;  Surgeon: Dalia HeadingMark A Jenkins, MD;  [facility];  Service: Gastroenterology;  Laterality: N/A;    Family History  Problem Relation Age of Onset  . Anesthesia problems Neg Hx   . Hypotension Neg Hx   . Malignant hyperthermia Neg Hx   . Pseudochol deficiency Neg Hx    Social History:  reports that he has never smoked. He does not have any smokeless tobacco history on file. He reports that he drinks alcohol. He reports that he does not use illicit drugs.  Allergies: No Known Allergies  Medications Prior to Admission  Medication Sig Dispense Refill  . levothyroxine (SYNTHROID, LEVOTHROID) 75 MCG tablet Take 1 tablet by mouth daily.  3  . losartan (COZAAR) 50 MG tablet Take 1 tablet by mouth daily.  3  .  omeprazole (PRILOSEC) 40 MG capsule Take 1 capsule (40 mg total) by mouth daily. 90 capsule 3    No results found for this or any previous visit (from the past 48 hour(s)). No results found.  ROS  Blood pressure 105/63, pulse 51, temperature 97.8 F (36.6 C), temperature source Oral, resp. rate 18, height 5' (1.524 m), weight 175 lb (79.379 kg), SpO2 99 %. Physical Exam  Constitutional: He appears well-developed and well-nourished.  HENT:  Mouth/Throat: Oropharynx is clear and moist.  Eyes: Conjunctivae are normal. No scleral icterus.  Neck: No thyromegaly present.  Cardiovascular: Normal rate, regular rhythm and normal heart sounds.   No murmur heard. Respiratory: Effort normal and breath sounds normal.  GI: Soft. He exhibits no distension and no mass. There is no tenderness.  Musculoskeletal: He exhibits no edema.  Lymphadenopathy:    He has no cervical adenopathy.  Neurological: He is alert.  Skin: Skin is warm and dry.     Assessment/Plan Solid food dysphagia. GERD. EGD with ED.  Rollen Selders U 06/27/2014, 1:19 PM

## 2014-06-28 ENCOUNTER — Encounter (HOSPITAL_COMMUNITY): Payer: Self-pay | Admitting: Internal Medicine

## 2014-06-28 LAB — H. PYLORI ANTIBODY, IGG

## 2014-07-02 ENCOUNTER — Encounter (INDEPENDENT_AMBULATORY_CARE_PROVIDER_SITE_OTHER): Payer: Self-pay | Admitting: *Deleted

## 2015-02-06 ENCOUNTER — Encounter (INDEPENDENT_AMBULATORY_CARE_PROVIDER_SITE_OTHER): Payer: Self-pay | Admitting: Internal Medicine

## 2015-05-30 ENCOUNTER — Ambulatory Visit (INDEPENDENT_AMBULATORY_CARE_PROVIDER_SITE_OTHER): Payer: BLUE CROSS/BLUE SHIELD | Admitting: Internal Medicine

## 2015-05-30 ENCOUNTER — Encounter (INDEPENDENT_AMBULATORY_CARE_PROVIDER_SITE_OTHER): Payer: Self-pay | Admitting: Internal Medicine

## 2015-05-30 VITALS — BP 104/70 | HR 64 | Temp 98.1°F | Ht 60.0 in | Wt 172.0 lb

## 2015-05-30 DIAGNOSIS — K219 Gastro-esophageal reflux disease without esophagitis: Secondary | ICD-10-CM

## 2015-05-30 DIAGNOSIS — R1310 Dysphagia, unspecified: Secondary | ICD-10-CM

## 2015-05-30 MED ORDER — OMEPRAZOLE 40 MG PO CPDR: 40.0000 mg | DELAYED_RELEASE_CAPSULE | Freq: Every day | ORAL | Status: DC

## 2015-05-30 NOTE — Progress Notes (Signed)
   Subjective:    Patient ID: Frank Carr, male    DOB: 1960/03/29, 55 y.o.   MRN: [id number]  HPI Here today for f/u. Hx of dysphagia and underwent an EGD/ED in June of 2016. He tells me he is dong better. He denies any dysphagia.  He can eat anything he wants. No heart burn which is controlled with Omeprazole.  He has a BM daily. No melena or BRRB. He works at [employer].  Wife present in room.      06/27/2014: EGD with ED  Indications: Patient is 55 year old Hispanic male who presents with solid food dysphagia. Also gives history of frequent heartburn which is well controlled with omeprazole which was started few weeks ago. Impression: Salmon colored mucosa involving distal segment of esophagus and non-circumferential pattern in maximal length of 15 mm. Biopsies taken post dilation. Small sliding hiatal hernia. Erosive antral gastritis. Esophagus dilated by passing 54 French Maloney dilator but no mucosal disruption induced.   H. pylori serology is negative. Biopsy from distal esophagus confirms Barrett's. Results reviewed with patient.  04/05/2013 Colonoscopy Screening, Dr. Lovell SheehanJenkins: normal.   09/19/2010 Colonoscopy: Dr. Karilyn Cotaehman: rectal bleeding Impression:  External hemorrhoids otherwise normal colonoscopy somewhat limited by quality of prep  Review of Systems Past Medical History  Diagnosis Date  . Rectal bleeding 06/09/2010  . Diarrhea 06/09/2010  . Vomiting 06/09/2010  . Abdominal pain 06/09/2010  . Flank pain 06/09/2010  . Arthritis     left knee    Past Surgical History  Procedure Laterality Date  . Ganglion cyst excision      left wrist  . Knee arthroscopy      left knee  . Colonoscopy  09/19/2010    Procedure: COLONOSCOPY;  Surgeon: Malissa HippoNajeeb U Rehman, MD;  [facility];  Service: Endoscopy;  Laterality: N/A;  10:00  .  Colonoscopy N/A 04/05/2012    Procedure: COLONOSCOPY;  Surgeon: Dalia HeadingMark A Jenkins, MD;  [facility];  Service: Gastroenterology;  Laterality: N/A;  . Esophagogastroduodenoscopy N/A 06/27/2014    Procedure: ESOPHAGOGASTRODUODENOSCOPY (EGD);  Surgeon: Malissa HippoNajeeb U Rehman, MD;  [facility];  Service: Endoscopy;  Laterality: N/A;  1255  . Esophageal dilation N/A 06/27/2014    Procedure: ESOPHAGEAL DILATION;  Surgeon: Malissa HippoNajeeb U Rehman, MD;  [facility];  Service: Endoscopy;  Laterality: N/A;    No Known Allergies  Current Outpatient Prescriptions on File Prior to Visit  Medication Sig Dispense Refill  . levothyroxine (SYNTHROID, LEVOTHROID) 75 MCG tablet Take 1 tablet by mouth daily.  3  . losartan (COZAAR) 50 MG tablet Take 1 tablet by mouth daily.  3  . omeprazole (PRILOSEC) 40 MG capsule Take 1 capsule (40 mg total) by mouth daily. 90 capsule 3   No current facility-administered medications on file prior to visit.        Objective:   Physical Exam Blood pressure 104/70, pulse 64, temperature 98.1 F (36.7 C), height 5' (1.524 m), weight 172 lb (78.019 kg). Alert and oriented. Skin warm and dry. Oral mucosa is moist.   . Sclera anicteric, conjunctivae is pink. Thyroid not enlarged. No cervical lymphadenopathy. Lungs clear. Heart regular rate and rhythm.  Abdomen is soft. Bowel sounds are positive. No hepatomegaly. No abdominal masses felt. No tenderness.  No edema to lower extremities.  .        Assessment & Plan:  Dysphagia. Resolved since EGD/ED.  Continue the Omeprazole 40mg  daily before breakfast. OV in one year.

## 2015-05-30 NOTE — Patient Instructions (Addendum)
Omeprazole 40mg  every day before breakfast. New  Rx for Omeprazole sent to his pharmacy.  OV in 1 year.

## 2016-05-28 ENCOUNTER — Encounter (INDEPENDENT_AMBULATORY_CARE_PROVIDER_SITE_OTHER): Payer: Self-pay | Admitting: Internal Medicine

## 2016-06-08 ENCOUNTER — Ambulatory Visit (INDEPENDENT_AMBULATORY_CARE_PROVIDER_SITE_OTHER): Payer: BLUE CROSS/BLUE SHIELD | Admitting: Internal Medicine

## 2016-07-06 ENCOUNTER — Encounter (INDEPENDENT_AMBULATORY_CARE_PROVIDER_SITE_OTHER): Payer: Self-pay

## 2016-07-06 ENCOUNTER — Encounter (INDEPENDENT_AMBULATORY_CARE_PROVIDER_SITE_OTHER): Payer: Self-pay | Admitting: Internal Medicine

## 2016-07-06 ENCOUNTER — Ambulatory Visit (INDEPENDENT_AMBULATORY_CARE_PROVIDER_SITE_OTHER): Payer: BLUE CROSS/BLUE SHIELD | Admitting: Internal Medicine

## 2016-07-06 VITALS — BP 116/70 | HR 60 | Temp 98.0°F | Ht 60.0 in | Wt 167.4 lb

## 2016-07-06 DIAGNOSIS — K219 Gastro-esophageal reflux disease without esophagitis: Secondary | ICD-10-CM

## 2016-07-06 DIAGNOSIS — K2270 Barrett's esophagus without dysplasia: Secondary | ICD-10-CM

## 2016-07-06 DIAGNOSIS — E039 Hypothyroidism, unspecified: Secondary | ICD-10-CM | POA: Insufficient documentation

## 2016-07-06 MED ORDER — OMEPRAZOLE 40 MG PO CPDR: 40.0000 mg | DELAYED_RELEASE_CAPSULE | Freq: Every day | ORAL | 3 refills | Status: DC

## 2016-07-06 NOTE — Patient Instructions (Signed)
Continue the Omeprazole.  OV in 1 year.  

## 2016-07-06 NOTE — Progress Notes (Signed)
Subjective:    Patient ID: Frank Carr, male    DOB: 09/07/1960, 56 y.o.   MRN: [id number] PCP Dr. Sudie BaileyKnowlton HPI   Here today for f/u. Last seen in May of 2017. Hx dysphagia. He was doing well at last OV. No dysphagia. Weight in May of 2017 was 172. Today his weight is 167.4 which was intentional. He is maintained on Omeprazole. He denies any dyspragia.  Acid reflux is controlled. He has break thru with hot peppers. No abdominal pain. BMs x 1 a day. No melena or BRRB      06/27/2014: EGD with ED  Indications: Patient is 56 year old Hispanic male who presents with solid food dysphagia. Also gives history of frequent heartburn which is well controlled with omeprazole which was started few weeks ago. Impression: Salmon colored mucosa involving distal segment of esophagus and non-circumferential pattern in maximal length of 15 mm. Biopsies taken post dilation. Small sliding hiatal hernia. Erosive antral gastritis. Esophagus dilated by passing 54 French Maloney dilator but no mucosal disruption induced.   H. pylori serology is negative. Biopsy from distal esophagus confirms Barrett's. Results reviewed with patient.  04/05/2013 Colonoscopy Screening, Dr. Lovell SheehanJenkins: normal.   09/19/2010 Colonoscopy: Dr. Karilyn Cotaehman: rectal bleeding Impression:  External hemorrhoids otherwise normal colonoscopy somewhat limited by quality of prep  Review of Systems     Past Medical History:  Diagnosis Date  . Abdominal pain 06/09/2010  . Arthritis    left knee  . Diarrhea 06/09/2010  . Flank pain 06/09/2010  . Hypothyroid   . Rectal bleeding 06/09/2010  . Vomiting 06/09/2010    Past Surgical History:  Procedure Laterality Date  . COLONOSCOPY  09/19/2010   Procedure: COLONOSCOPY;  Surgeon: Malissa HippoNajeeb U Rehman, MD;  [facility];  Service: Endoscopy;  Laterality: N/A;   10:00  . COLONOSCOPY N/A 04/05/2012   Procedure: COLONOSCOPY;  Surgeon: Dalia HeadingMark A Jenkins, MD;  [facility];  Service: Gastroenterology;  Laterality: N/A;  . ESOPHAGEAL DILATION N/A 06/27/2014   Procedure: ESOPHAGEAL DILATION;  Surgeon: Malissa HippoNajeeb U Rehman, MD;  [facility];  Service: Endoscopy;  Laterality: N/A;  . ESOPHAGOGASTRODUODENOSCOPY N/A 06/27/2014   Procedure: ESOPHAGOGASTRODUODENOSCOPY (EGD);  Surgeon: Malissa HippoNajeeb U Rehman, MD;  [facility];  Service: Endoscopy;  Laterality: N/A;  1255  . GANGLION CYST EXCISION     left wrist  . KNEE ARTHROSCOPY     left knee    No Known Allergies  Current Outpatient Prescriptions on File Prior to Visit  Medication Sig Dispense Refill  . aspirin 81 MG tablet Take 81 mg by mouth daily.    Marland Kitchen. levothyroxine (SYNTHROID, LEVOTHROID) 75 MCG tablet Take 1 tablet by mouth daily.  3  . losartan (COZAAR) 50 MG tablet Take 1 tablet by mouth daily.  3  . omeprazole (PRILOSEC) 40 MG capsule Take 1 capsule (40 mg total) by mouth daily. 90 capsule 3   No current facility-administered medications on file prior to visit.      Objective:   Physical Exam Blood pressure 116/70, pulse 60, temperature 98 F (36.7 C), height 5' (1.524 m), weight 167 lb 6.4 oz (75.9 kg).  Alert and oriented. Skin warm and dry. Oral mucosa is moist.   . Sclera anicteric, conjunctivae is pink. Thyroid not enlarged. No cervical lymphadenopathy. Lungs clear. Heart regular rate and rhythm.  Abdomen is soft. Bowel sounds are positive. No hepatomegaly. No abdominal masses felt. No tenderness.  No edema to lower extremities. Patient is alert  and oriented.        Assessment & Plan:  Dysphagia. Continue the Omeprazole.  OV in 1 year.

## 2017-09-06 ENCOUNTER — Other Ambulatory Visit (INDEPENDENT_AMBULATORY_CARE_PROVIDER_SITE_OTHER): Payer: Self-pay | Admitting: Internal Medicine

## 2017-09-06 DIAGNOSIS — K219 Gastro-esophageal reflux disease without esophagitis: Secondary | ICD-10-CM

## 2018-07-25 ENCOUNTER — Other Ambulatory Visit: Payer: Self-pay | Admitting: Internal Medicine

## 2018-07-25 ENCOUNTER — Other Ambulatory Visit: Payer: Self-pay

## 2018-07-25 DIAGNOSIS — Z20822 Contact with and (suspected) exposure to covid-19: Secondary | ICD-10-CM

## 2018-07-28 ENCOUNTER — Telehealth: Payer: Self-pay

## 2018-07-28 LAB — NOVEL CORONAVIRUS, NAA: SARS-CoV-2, NAA: DETECTED — AB

## 2018-07-28 NOTE — Telephone Encounter (Signed)
Frank Carr - [facility]/[phone] called in COVID19 lab results: Detected (Positive). 

## 2018-07-29 ENCOUNTER — Ambulatory Visit: Payer: Self-pay | Admitting: *Deleted

## 2018-07-29 NOTE — Telephone Encounter (Signed)
  Pt called in for COVID-19 results.   He was returning a call where someone called him earlier. I used Geophysical data processor for [place] [phone]. I let him know his test result came back positive for the COVID-19 virus.  I went over the Home Care information using the interpreter.  No questions from pt.  Reason for Disposition . [1] POEUM-35 diagnosed by positive lab test AND [2] mild symptoms (e.g., cough, fever, others) AND [3] no complications or SOB  Answer Assessment - Initial Assessment Questions 1. COVID-19 DIAGNOSIS: "Who made your Coronavirus (COVID-19) diagnosis?" "Was it confirmed by a positive lab test?" If not diagnosed by a HCP, ask "Are there lots of cases (community spread) where you live?" (See public health department website, if unsure)     You were tested for COVID-19.   Your test result came back positive for the virus.    I got a Spanish interpreter on the line to assist with communication. 2. ONSET: "When did the COVID-19 symptoms start?"      *No Answer* 3. WORST SYMPTOM: "What is your worst symptom?" (e.g., cough, fever, shortness of breath, muscle aches)     *No Answer* 4. COUGH: "Do you have a cough?" If so, ask: "How bad is the cough?"       *No Answer* 5. FEVER: "Do you have a fever?" If so, ask: "What is your temperature, how was it measured, and when did it start?"     *No Answer* 6. RESPIRATORY STATUS: "Describe your breathing?" (e.g., shortness of breath, wheezing, unable to speak)      *No Answer* 7. BETTER-SAME-WORSE: "Are you getting better, staying the same or getting worse compared to yesterday?"  If getting worse, ask, "In what way?"     *No Answer* 8. HIGH RISK DISEASE: "Do you have any chronic medical problems?" (e.g., asthma, heart or lung disease, weak immune system, etc.)     *No Answer* 9. PREGNANCY: "Is there any chance you are pregnant?" "When was your last menstrual period?"     *No Answer* 10. OTHER SYMPTOMS: "Do you have any other  symptoms?"  (e.g., chills, fatigue, headache, loss of smell or taste, muscle pain, sore throat)       *No Answer*  Protocols used: CORONAVIRUS (COVID-19) DIAGNOSED OR SUSPECTED-A-AH

## 2019-02-06 ENCOUNTER — Other Ambulatory Visit: Payer: Self-pay | Admitting: Family Medicine

## 2019-02-06 ENCOUNTER — Other Ambulatory Visit (HOSPITAL_COMMUNITY): Payer: Self-pay | Admitting: Family Medicine

## 2019-02-06 DIAGNOSIS — G510 Bell's palsy: Secondary | ICD-10-CM

## 2019-02-08 ENCOUNTER — Ambulatory Visit (HOSPITAL_COMMUNITY)
Admission: RE | Admit: 2019-02-08 | Discharge: 2019-02-08 | Disposition: A | Discharge status: Home / Self Care | Payer: BC Managed Care – PPO | Source: Ambulatory Visit | Attending: Family Medicine | Admitting: Family Medicine

## 2019-02-08 ENCOUNTER — Other Ambulatory Visit: Payer: Self-pay

## 2019-02-08 DIAGNOSIS — G510 Bell's palsy: Secondary | ICD-10-CM | POA: Insufficient documentation

## 2021-01-09 ENCOUNTER — Ambulatory Visit
Admission: EM | Admit: 2021-01-09 | Discharge: 2021-01-09 | Disposition: A | Discharge status: Home / Self Care | Payer: BC Managed Care – PPO | Attending: Urgent Care | Admitting: Urgent Care

## 2021-01-09 ENCOUNTER — Other Ambulatory Visit: Payer: Self-pay

## 2021-01-09 ENCOUNTER — Encounter: Payer: Self-pay | Admitting: Emergency Medicine

## 2021-01-09 DIAGNOSIS — H65191 Other acute nonsuppurative otitis media, right ear: Secondary | ICD-10-CM | POA: Diagnosis not present

## 2021-01-09 DIAGNOSIS — H9201 Otalgia, right ear: Secondary | ICD-10-CM

## 2021-01-09 MED ORDER — PSEUDOEPHEDRINE HCL 30 MG PO TABS: 30.0000 mg | ORAL_TABLET | Freq: Three times a day (TID) | ORAL | 0 refills | Status: DC | PRN

## 2021-01-09 MED ORDER — CETIRIZINE HCL 10 MG PO TABS: 10.0000 mg | ORAL_TABLET | Freq: Every day | ORAL | 0 refills | Status: DC

## 2021-01-09 MED ORDER — AMOXICILLIN-POT CLAVULANATE 875-125 MG PO TABS: 1.0000 | ORAL_TABLET | Freq: Two times a day (BID) | ORAL | 0 refills | Status: DC

## 2021-01-09 NOTE — ED Provider Notes (Signed)
[facility]   MRN: [id number] DOB: 1960-04-28  Subjective:   Frank Carr is a 60 y.o. male presenting for 2-week history of persistent and worsening right ear pain.  No drainage, bleeding, tinnitus, fevers.  Patient has had persistent ear fullness and decreased hearing.  They did try over-the-counter eardrops with minimal relief.  Denies chest pain, shob, cough, throat pain, sinus pain, runny or stuffy nose.   No current facility-administered medications for this encounter.  Current Outpatient Medications:    aspirin 81 MG tablet, Take 81 mg by mouth daily., Disp: , Rfl:    levothyroxine (SYNTHROID, LEVOTHROID) 75 MCG tablet, Take 1 tablet by mouth daily., Disp: , Rfl: 3   losartan (COZAAR) 50 MG tablet, Take 1 tablet by mouth daily., Disp: , Rfl: 3   omeprazole (PRILOSEC) 40 MG capsule, TAKE 1 CAPSULE(40 MG) BY MOUTH DAILY, Disp: 90 capsule, Rfl: 3   No Known Allergies  Past Medical History:  Diagnosis Date   Abdominal pain 06/09/2010   Arthritis    left knee   Diarrhea 06/09/2010   Flank pain 06/09/2010   Hypothyroid    Rectal bleeding 06/09/2010   Vomiting 06/09/2010     Past Surgical History:  Procedure Laterality Date   COLONOSCOPY  09/19/2010   Procedure: COLONOSCOPY;  Surgeon: Malissa Hippo, MD;  [facility];  Service: Endoscopy;  Laterality: N/A;  10:00   COLONOSCOPY N/A 04/05/2012   Procedure: COLONOSCOPY;  Surgeon: Dalia Heading, MD;  [facility];  Service: Gastroenterology;  Laterality: N/A;   ESOPHAGEAL DILATION N/A 06/27/2014   Procedure: ESOPHAGEAL DILATION;  Surgeon: Malissa Hippo, MD;  [facility];  Service: Endoscopy;  Laterality: N/A;   ESOPHAGOGASTRODUODENOSCOPY N/A 06/27/2014   Procedure: ESOPHAGOGASTRODUODENOSCOPY (EGD);  Surgeon: Malissa Hippo, MD;  [facility];  Service: Endoscopy;  Laterality: N/A;  1255   GANGLION CYST EXCISION     left wrist   KNEE ARTHROSCOPY     left knee     Family History  Problem Relation Age of Onset   Anesthesia problems Neg Hx    Hypotension Neg Hx    Malignant hyperthermia Neg Hx    Pseudochol deficiency Neg Hx     Social History   Tobacco Use   Smoking status: Never   Smokeless tobacco: Never  Substance Use Topics   Alcohol use: Yes    Comment: Drinks 1-2 drinks a month (beer)   Drug use: No    ROS   Objective:   Vitals: BP (!) 154/91 (BP Location: Right Arm)    Pulse 67    Temp 98.1 F (36.7 C) (Oral)    Resp 18    SpO2 94%   Physical Exam Constitutional:      General: He is not in acute distress.    Appearance: Normal appearance. He is well-developed and normal weight. He is not ill-appearing, toxic-appearing or diaphoretic.  HENT:     Head: Normocephalic and atraumatic.     Right Ear: Ear canal and external ear normal. Tenderness present. A middle ear effusion is present. There is no impacted cerumen. Tympanic membrane is erythematous. Tympanic membrane is not perforated or bulging.     Left Ear: Tympanic membrane, ear canal and external ear normal. There is no impacted cerumen.     Nose: Nose normal. No congestion or rhinorrhea.     Mouth/Throat:     Mouth: Mucous membranes are moist.     Pharynx: Oropharynx  is clear. No oropharyngeal exudate or posterior oropharyngeal erythema.  Eyes:     General: No scleral icterus.       Right eye: No discharge.        Left eye: No discharge.     Extraocular Movements: Extraocular movements intact.     Conjunctiva/sclera: Conjunctivae normal.     Pupils: Pupils are equal, round, and reactive to light.  Cardiovascular:     Rate and Rhythm: Normal rate.  Pulmonary:     Effort: Pulmonary effort is normal.  Musculoskeletal:     Cervical back: Normal range of motion and neck supple. No rigidity. No muscular tenderness.  Neurological:     General: No focal deficit present.     Mental Status: He is alert and oriented to person, place, and time.  Psychiatric:         Mood and Affect: Mood normal.        Behavior: Behavior normal.        Thought Content: Thought content normal.        Judgment: Judgment normal.    Assessment and Plan :   PDMP not reviewed this encounter.  1. Acute otalgia, right   2. Other non-recurrent acute nonsuppurative otitis media of right ear    Start Augmentin to cover for otitis media. Use supportive care otherwise. Counseled patient on potential for adverse effects with medications prescribed/recommended today, ER and return-to-clinic precautions discussed, patient verbalized understanding.     Wallis Bamberg, [place] 01/09/21 [id number]

## 2021-01-09 NOTE — ED Triage Notes (Signed)
Right ear pain x2 weeks

## 2021-05-13 DIAGNOSIS — I10 Essential (primary) hypertension: Secondary | ICD-10-CM | POA: Diagnosis not present

## 2021-05-13 DIAGNOSIS — M545 Low back pain, unspecified: Secondary | ICD-10-CM | POA: Diagnosis not present

## 2021-05-13 DIAGNOSIS — Z79891 Long term (current) use of opiate analgesic: Secondary | ICD-10-CM | POA: Diagnosis not present

## 2021-05-13 DIAGNOSIS — H9191 Unspecified hearing loss, right ear: Secondary | ICD-10-CM | POA: Diagnosis not present

## 2021-07-27 IMAGING — MR MR HEAD W/O CM
7 of 10 series · 27 of 48 positions shown · non-contrast
Comparison: CT head 01/20/2019

CLINICAL DATA: Bell's palsy.  Right facial droop

EXAM:
MRI HEAD WITHOUT CONTRAST
TECHNIQUE: Multiplanar, multiecho pulse sequences of the brain and surrounding
structures were obtained without intravenous contrast.

[Series 2: DWI · axial · 3.0mm · 0.71mm/px · z∈[-76,+86]mm · 7 of 55 slices shown (1 of 2)]
[im 1/55]
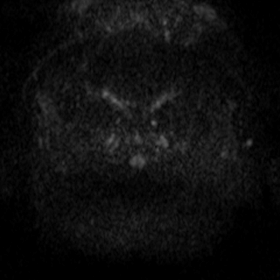
[im 10/55]
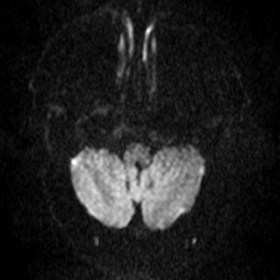
[im 19/55]
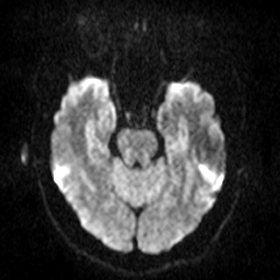
[im 28/55]
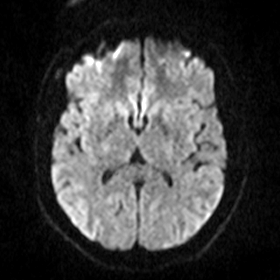
[im 37/55]
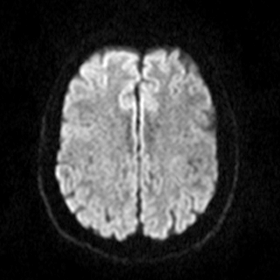
[im 46/55]
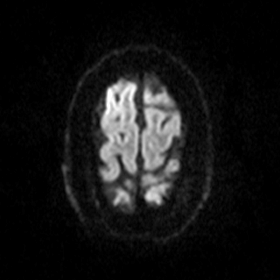
[im 55/55]
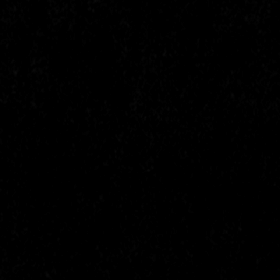

[Series 4: DWI · coronal · 5.0mm · 0.50mm/px · 5 of 38 slices shown (2 of 2)]
[im 1/38]
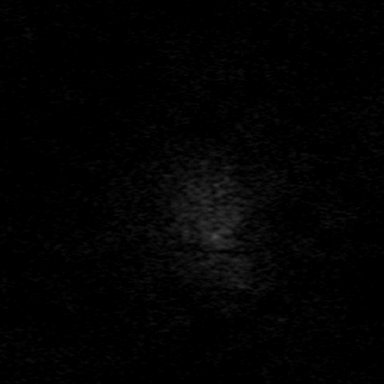
[im 10/38]
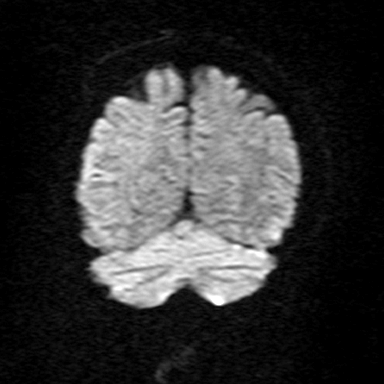
[im 19/38]
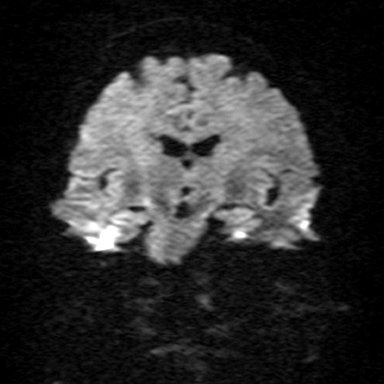
[im 28/38]
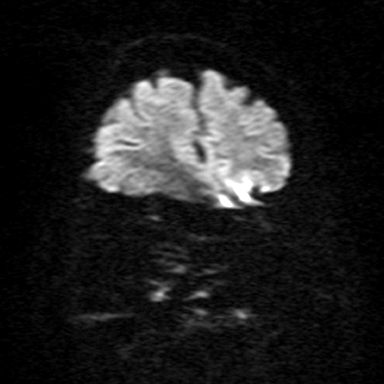
[im 38/38]
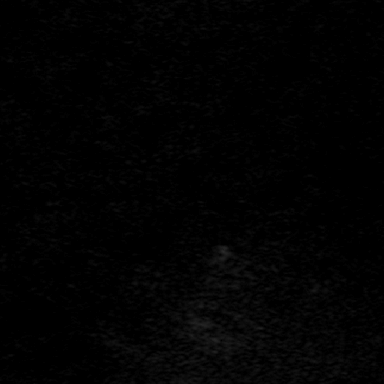

[Series 6: T1 · sagittal · 5.0mm · 0.38mm/px · 1 of 21 slices shown]
[im 1/21]
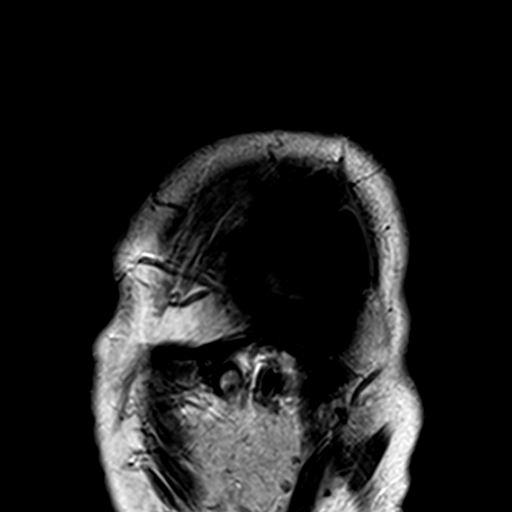

[Series 7: T2 · axial · 5.0mm · 0.45mm/px · z∈[-75,+81]mm · 3 of 25 slices shown (1 of 3)]
[im 1/25]
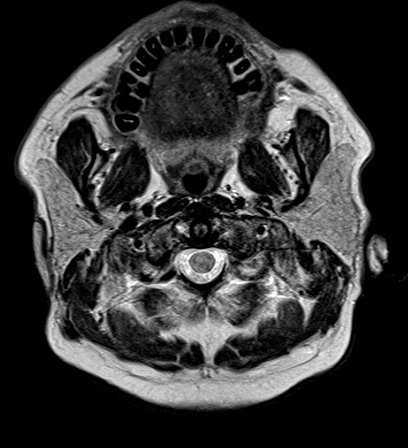
[im 13/25]
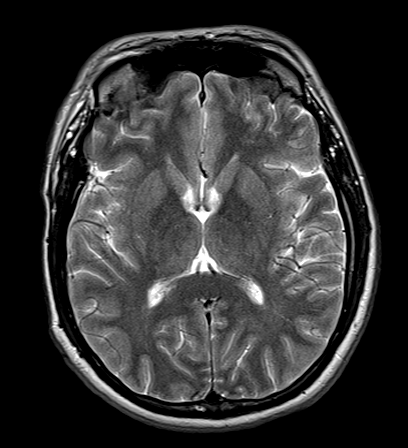
[im 25/25]
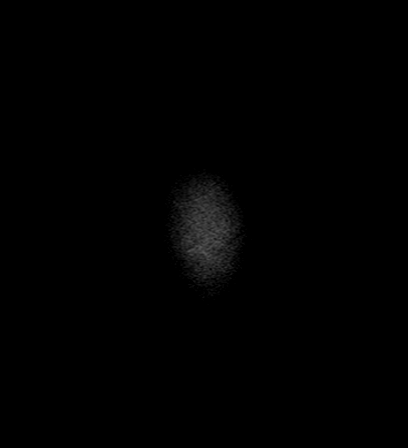

[Series 8: T2 · axial · 4.0mm · 0.38mm/px · z∈[-69,+76]mm · 3 of 30 slices shown (2 of 3)]
[im 1/30]
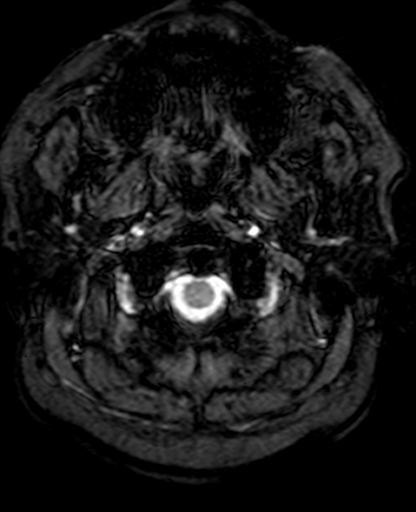
[im 15/30]
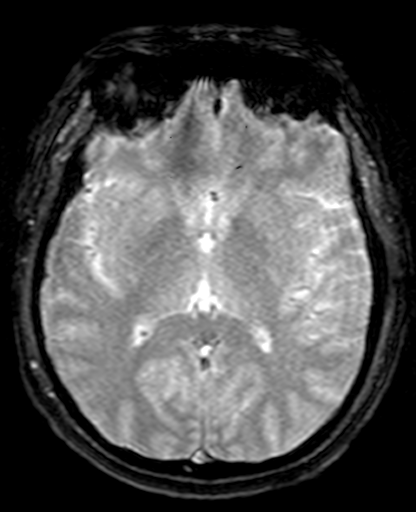
[im 30/30]
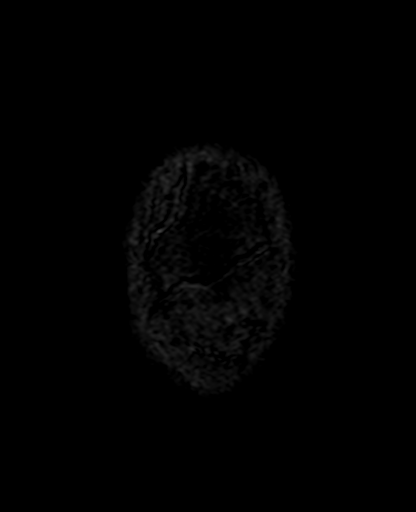

[Series 9: FLAIR · axial · 3.0mm · 0.30mm/px · z∈[-66,+72]mm · 5 of 47 slices shown]
[im 1/47]
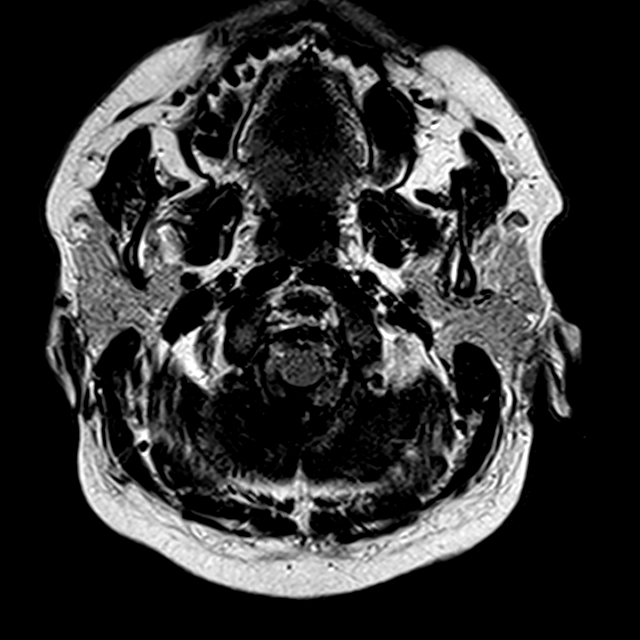
[im 12/47]
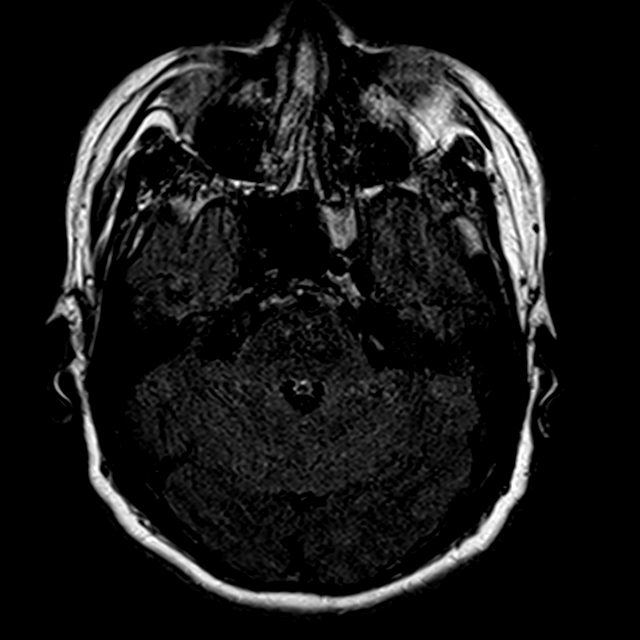
[im 24/47]
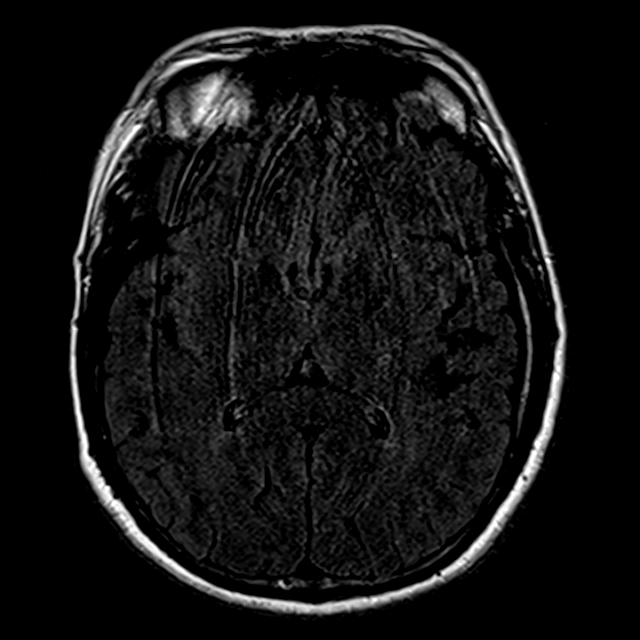
[im 35/47]
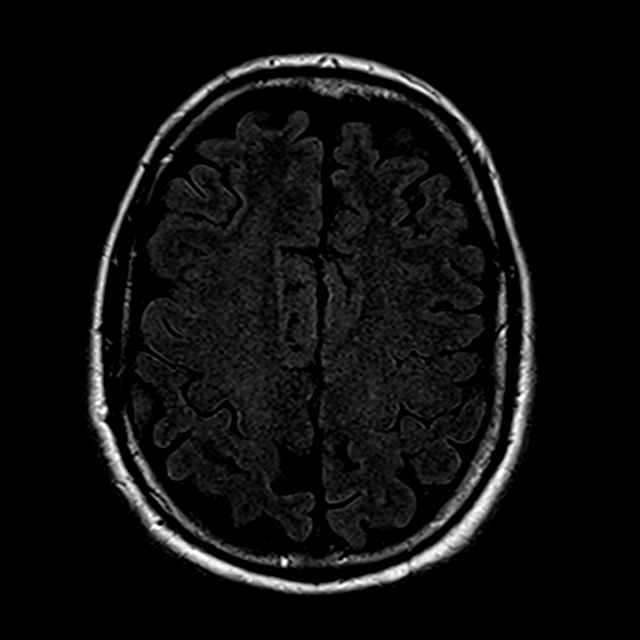
[im 47/47]
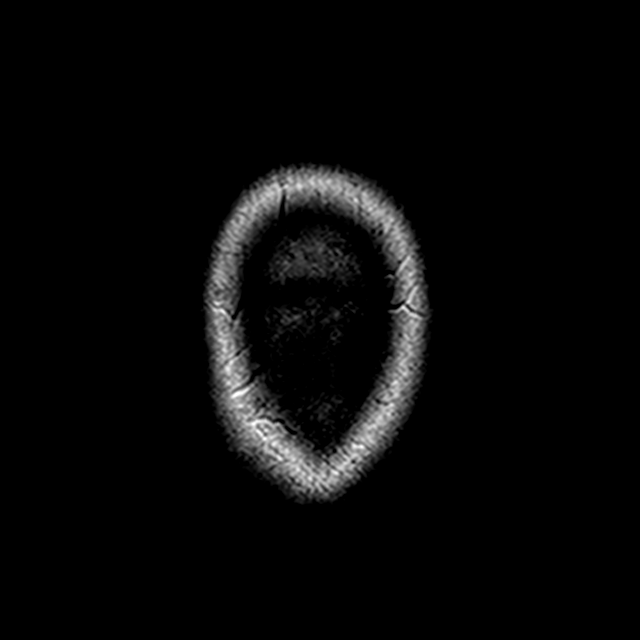

[Series 11: T2 · coronal · 5.0mm · 0.37mm/px · 3 of 28 slices shown (3 of 3)]
[im 1/28]
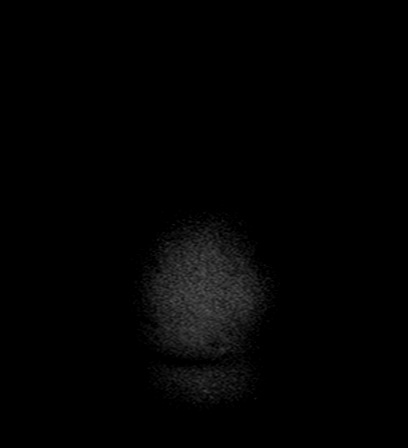
[im 14/28]
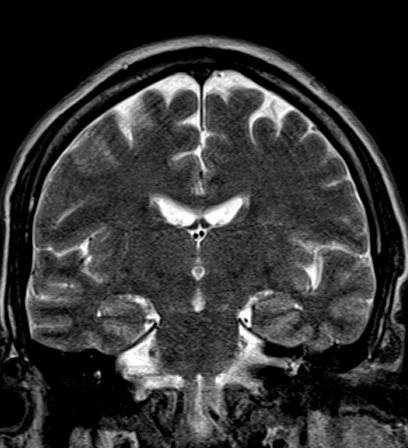
[im 28/28]
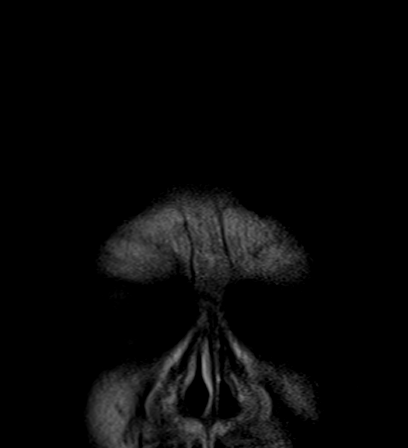

[27 of 48 positions shown; findings below may reference images not displayed]

FINDINGS: Brain: No acute infarction, hemorrhage, hydrocephalus, extra-axial
collection or mass lesion. Several small periventricular deep white
matter hyperintensities bilaterally appear chronic. Brainstem and
cerebellum normal.

Vascular: Normal arterial flow voids.

Skull and upper cervical spine: No focal skeletal lesion.

Sinuses/Orbits: Negative

Other: None
IMPRESSION: No acute abnormality. Minimal chronic white matter changes, typical
for age and likely due to chronic microvascular ischemia.

## 2021-08-08 DIAGNOSIS — Z79891 Long term (current) use of opiate analgesic: Secondary | ICD-10-CM | POA: Diagnosis not present

## 2021-08-08 DIAGNOSIS — M545 Low back pain, unspecified: Secondary | ICD-10-CM | POA: Diagnosis not present

## 2021-08-08 DIAGNOSIS — E6609 Other obesity due to excess calories: Secondary | ICD-10-CM | POA: Diagnosis not present

## 2021-08-08 DIAGNOSIS — H6092 Unspecified otitis externa, left ear: Secondary | ICD-10-CM | POA: Diagnosis not present

## 2021-11-03 DIAGNOSIS — I10 Essential (primary) hypertension: Secondary | ICD-10-CM | POA: Diagnosis not present

## 2021-11-03 DIAGNOSIS — E039 Hypothyroidism, unspecified: Secondary | ICD-10-CM | POA: Diagnosis not present

## 2021-11-03 DIAGNOSIS — Z125 Encounter for screening for malignant neoplasm of prostate: Secondary | ICD-10-CM | POA: Diagnosis not present

## 2021-11-03 DIAGNOSIS — R799 Abnormal finding of blood chemistry, unspecified: Secondary | ICD-10-CM | POA: Diagnosis not present

## 2021-11-03 DIAGNOSIS — E559 Vitamin D deficiency, unspecified: Secondary | ICD-10-CM | POA: Diagnosis not present

## 2021-12-31 DIAGNOSIS — I69392 Facial weakness following cerebral infarction: Secondary | ICD-10-CM | POA: Diagnosis not present

## 2021-12-31 DIAGNOSIS — Z23 Encounter for immunization: Secondary | ICD-10-CM | POA: Diagnosis not present

## 2021-12-31 DIAGNOSIS — E785 Hyperlipidemia, unspecified: Secondary | ICD-10-CM | POA: Diagnosis not present

## 2021-12-31 DIAGNOSIS — I10 Essential (primary) hypertension: Secondary | ICD-10-CM | POA: Diagnosis not present

## 2021-12-31 DIAGNOSIS — M545 Low back pain, unspecified: Secondary | ICD-10-CM | POA: Diagnosis not present

## 2022-01-28 DIAGNOSIS — I10 Essential (primary) hypertension: Secondary | ICD-10-CM | POA: Diagnosis not present

## 2022-01-28 DIAGNOSIS — Z79891 Long term (current) use of opiate analgesic: Secondary | ICD-10-CM | POA: Diagnosis not present

## 2022-01-28 DIAGNOSIS — E7800 Pure hypercholesterolemia, unspecified: Secondary | ICD-10-CM | POA: Diagnosis not present

## 2022-06-02 DIAGNOSIS — I10 Essential (primary) hypertension: Secondary | ICD-10-CM | POA: Diagnosis not present

## 2022-06-02 DIAGNOSIS — Z79891 Long term (current) use of opiate analgesic: Secondary | ICD-10-CM | POA: Diagnosis not present

## 2022-06-02 DIAGNOSIS — M545 Low back pain, unspecified: Secondary | ICD-10-CM | POA: Diagnosis not present

## 2022-06-02 DIAGNOSIS — E039 Hypothyroidism, unspecified: Secondary | ICD-10-CM | POA: Diagnosis not present

## 2022-07-14 DIAGNOSIS — R220 Localized swelling, mass and lump, head: Secondary | ICD-10-CM | POA: Diagnosis not present

## 2022-08-17 DIAGNOSIS — E039 Hypothyroidism, unspecified: Secondary | ICD-10-CM | POA: Diagnosis not present

## 2022-08-17 DIAGNOSIS — I10 Essential (primary) hypertension: Secondary | ICD-10-CM | POA: Diagnosis not present

## 2022-08-17 DIAGNOSIS — K219 Gastro-esophageal reflux disease without esophagitis: Secondary | ICD-10-CM | POA: Diagnosis not present

## 2022-08-17 DIAGNOSIS — E782 Mixed hyperlipidemia: Secondary | ICD-10-CM | POA: Diagnosis not present

## 2022-08-17 DIAGNOSIS — Z0189 Encounter for other specified special examinations: Secondary | ICD-10-CM | POA: Diagnosis not present

## 2022-08-18 DIAGNOSIS — M545 Low back pain, unspecified: Secondary | ICD-10-CM | POA: Diagnosis not present

## 2022-08-18 DIAGNOSIS — M25562 Pain in left knee: Secondary | ICD-10-CM | POA: Diagnosis not present

## 2022-08-18 DIAGNOSIS — E039 Hypothyroidism, unspecified: Secondary | ICD-10-CM | POA: Diagnosis not present

## 2022-08-18 DIAGNOSIS — I10 Essential (primary) hypertension: Secondary | ICD-10-CM | POA: Diagnosis not present

## 2022-08-28 DIAGNOSIS — Z125 Encounter for screening for malignant neoplasm of prostate: Secondary | ICD-10-CM | POA: Diagnosis not present

## 2022-08-28 DIAGNOSIS — I10 Essential (primary) hypertension: Secondary | ICD-10-CM | POA: Diagnosis not present

## 2022-08-28 DIAGNOSIS — Z6834 Body mass index (BMI) 34.0-34.9, adult: Secondary | ICD-10-CM | POA: Diagnosis not present

## 2022-09-08 DIAGNOSIS — E782 Mixed hyperlipidemia: Secondary | ICD-10-CM | POA: Diagnosis not present

## 2022-09-08 DIAGNOSIS — Z0001 Encounter for general adult medical examination with abnormal findings: Secondary | ICD-10-CM | POA: Diagnosis not present

## 2022-09-08 DIAGNOSIS — E669 Obesity, unspecified: Secondary | ICD-10-CM | POA: Diagnosis not present

## 2022-09-08 DIAGNOSIS — I10 Essential (primary) hypertension: Secondary | ICD-10-CM | POA: Diagnosis not present

## 2022-09-08 DIAGNOSIS — E039 Hypothyroidism, unspecified: Secondary | ICD-10-CM | POA: Diagnosis not present

## 2022-09-24 DIAGNOSIS — Z8669 Personal history of other diseases of the nervous system and sense organs: Secondary | ICD-10-CM | POA: Diagnosis not present

## 2022-09-24 DIAGNOSIS — H2513 Age-related nuclear cataract, bilateral: Secondary | ICD-10-CM | POA: Diagnosis not present

## 2022-09-24 DIAGNOSIS — H524 Presbyopia: Secondary | ICD-10-CM | POA: Diagnosis not present

## 2022-09-24 DIAGNOSIS — Z9889 Other specified postprocedural states: Secondary | ICD-10-CM | POA: Diagnosis not present

## 2022-09-24 DIAGNOSIS — H5203 Hypermetropia, bilateral: Secondary | ICD-10-CM | POA: Diagnosis not present

## 2022-09-24 DIAGNOSIS — H52223 Regular astigmatism, bilateral: Secondary | ICD-10-CM | POA: Diagnosis not present

## 2022-11-24 ENCOUNTER — Emergency Department (HOSPITAL_COMMUNITY)
Admission: EM | Admit: 2022-11-24 | Discharge: 2022-11-24 | Disposition: A | Discharge status: Home / Self Care | Payer: BC Managed Care – PPO | Attending: Emergency Medicine | Admitting: Emergency Medicine

## 2022-11-24 ENCOUNTER — Emergency Department (HOSPITAL_COMMUNITY): Payer: BC Managed Care – PPO

## 2022-11-24 ENCOUNTER — Encounter (HOSPITAL_COMMUNITY): Payer: Self-pay

## 2022-11-24 ENCOUNTER — Other Ambulatory Visit: Payer: Self-pay

## 2022-11-24 DIAGNOSIS — I10 Essential (primary) hypertension: Secondary | ICD-10-CM | POA: Insufficient documentation

## 2022-11-24 DIAGNOSIS — Z7982 Long term (current) use of aspirin: Secondary | ICD-10-CM | POA: Diagnosis not present

## 2022-11-24 DIAGNOSIS — Z79899 Other long term (current) drug therapy: Secondary | ICD-10-CM | POA: Insufficient documentation

## 2022-11-24 DIAGNOSIS — R079 Chest pain, unspecified: Secondary | ICD-10-CM | POA: Diagnosis not present

## 2022-11-24 DIAGNOSIS — R0789 Other chest pain: Secondary | ICD-10-CM | POA: Diagnosis not present

## 2022-11-24 DIAGNOSIS — Z8673 Personal history of transient ischemic attack (TIA), and cerebral infarction without residual deficits: Secondary | ICD-10-CM | POA: Diagnosis not present

## 2022-11-24 DIAGNOSIS — M25512 Pain in left shoulder: Secondary | ICD-10-CM | POA: Insufficient documentation

## 2022-11-24 LAB — TROPONIN I (HIGH SENSITIVITY)
Troponin I (High Sensitivity): 6 ng/L (ref <18)
Troponin I (High Sensitivity): 6 ng/L (ref <18)

## 2022-11-24 LAB — BASIC METABOLIC PANEL
Anion gap: 9 (ref 5–15)
BUN: 14 mg/dL (ref 6–20)
CO2: 28 mmol/L (ref 22–32)
Calcium: 8.8 mg/dL — ABNORMAL LOW (ref 8.9–10.3)
Chloride: 101 mmol/L (ref 98–111)
Creatinine, Ser: 0.67 mg/dL (ref 0.61–1.24)
GFR, Estimated: >60 mL/min (ref >60)
Glucose, Bld: 105 mg/dL — ABNORMAL HIGH (ref 70–99)
Potassium: 3.3 mmol/L — ABNORMAL LOW (ref 3.5–5.1)
Sodium: 138 mmol/L (ref 135–145)

## 2022-11-24 LAB — CBC
HCT: 41.9 % (ref 39.0–52.0)
Hemoglobin: 14.5 g/dL (ref 13.0–17.0)
MCH: 30.7 pg (ref 26.0–34.0)
MCHC: 34.6 g/dL (ref 30.0–36.0)
MCV: 88.6 fL (ref 80.0–100.0)
Platelets: 261 10*3/uL (ref 150–400)
RBC: 4.73 MIL/uL (ref 4.22–5.81)
RDW: 12.7 % (ref 11.5–15.5)
WBC: 6.8 10*3/uL (ref 4.0–10.5)
nRBC: 0 % (ref 0.0–0.2)

## 2022-11-24 MED ORDER — KETOROLAC TROMETHAMINE 15 MG/ML IJ SOLN
15.0000 mg | Freq: Once | INTRAMUSCULAR | Status: AC
  Administered 2022-11-24: 15 mg via INTRAMUSCULAR
  Filled 2022-11-24: qty 1

## 2022-11-24 MED ORDER — POTASSIUM CHLORIDE CRYS ER 20 MEQ PO TBCR
40.0000 meq | EXTENDED_RELEASE_TABLET | Freq: Once | ORAL | Status: AC
  Administered 2022-11-24: 40 meq via ORAL
  Filled 2022-11-24: qty 2

## 2022-11-24 MED ORDER — KETOROLAC TROMETHAMINE 15 MG/ML IJ SOLN: 15.0000 mg | Freq: Once | INTRAMUSCULAR | Status: DC

## 2022-11-24 MED ORDER — LIDOCAINE 5 % EX PTCH: 1.0000 | MEDICATED_PATCH | CUTANEOUS | 0 refills | Status: DC

## 2022-11-24 NOTE — ED Provider Notes (Signed)
[place] EMERGENCY DEPARTMENT AT [facility] Provider Note   CSN: [id number] Arrival date & time: 11/24/22  1533     History {Add pertinent medical, surgical, social history, OB history to HPI:1} Chief Complaint  Patient presents with   Chest Pain    Frank Carr is a 62 y.o. male.  He has past medical history of CVA, hypertension.  Presents to the ER complaining of chest pain that started today at noon while at work.  He works separating produce good from bad.  Sometimes he does pick up up to 10 pounds but no significant heavy lifting.  Pain is in the left upper chest left shoulder and arm and left trapezius area.  It is worse with moving the left arm, is not associated with exertion, no sweating, chest pain or shortness of breath, no pleurisy.  No calf pain or swelling.  No nausea or vomiting.  No dizziness or syncope   Chest Pain      Home Medications Prior to Admission medications   Medication Sig Start Date End Date Taking? Authorizing Provider  amoxicillin-clavulanate (AUGMENTIN) 875-125 MG tablet Take 1 tablet by mouth every 12 (twelve) hours. 01/09/21   Wallis Bamberg, PA-C  aspirin 81 MG tablet Take 81 mg by mouth daily.    [provider]  cetirizine (ZYRTEC ALLERGY) 10 MG tablet Take 1 tablet (10 mg total) by mouth daily. 01/09/21   Wallis Bamberg, PA-C  levothyroxine (SYNTHROID, LEVOTHROID) 75 MCG tablet Take 1 tablet by mouth daily. 05/18/14   [provider]  losartan (COZAAR) 50 MG tablet Take 1 tablet by mouth daily. 05/18/14   [provider]  omeprazole (PRILOSEC) 40 MG capsule TAKE 1 CAPSULE(40 MG) BY MOUTH DAILY 09/06/17   Setzer, Brand Males, NP  pseudoephedrine (SUDAFED) 30 MG tablet Take 1 tablet (30 mg total) by mouth every 8 (eight) hours as needed for congestion. 01/09/21   Wallis Bamberg, PA-C      Allergies    Patient has no known allergies.    Review of Systems   Review of Systems  Cardiovascular:  Positive for chest  pain.    Physical Exam Updated Vital Signs BP 136/63   Pulse (!) 57   Temp 98.3 F (36.8 C)   Resp 18   Ht 5' (1.524 m)   Wt 80.7 kg   SpO2 96%   BMI 34.76 kg/m  Physical Exam  ED Results / Procedures / Treatments   Labs (all labs ordered are listed, but only abnormal results are displayed) Labs Reviewed  BASIC METABOLIC PANEL - Abnormal; Notable for the following components:      Result Value   Potassium 3.3 (*)    Glucose, Bld 105 (*)    Calcium 8.8 (*)    All other components within normal limits  CBC  TROPONIN I (HIGH SENSITIVITY)  TROPONIN I (HIGH SENSITIVITY)    EKG EKG Interpretation Date/Time:  Tuesday November 24 2022 15:52:36 EDT Ventricular Rate:  62 PR Interval:  164 QRS Duration:  92 QT Interval:  444 QTC Calculation: 450 R Axis:   5  Text Interpretation: Normal sinus rhythm Minimal voltage criteria for LVH, may be normal variant ( R in aVL ) Borderline ECG No previous ECGs available Confirmed by Vonita Moss [phone]) on 11/24/2022 3:58:32 PM  Radiology DG Chest 2 View  Result Date: 11/24/2022 CLINICAL DATA:  Chest pain EXAM: CHEST - 2 VIEW COMPARISON:  None Available. FINDINGS: The heart size and mediastinal contours are  within normal limits. Both lungs are clear. The visualized skeletal structures are unremarkable. IMPRESSION: No active cardiopulmonary disease. Electronically Signed   By: Darliss Cheney M.D.   On: 11/24/2022 19:06    Procedures Procedures  {Document cardiac monitor, telemetry assessment procedure when appropriate:1}  Medications Ordered in ED Medications - No data to display  ED Course/ Medical Decision Making/ A&P   {   Click here for ABCD2, HEART and other calculatorsREFRESH Note before signing :1}                              Medical Decision Making The patient presented today for chest pain that had started at noon today, has improved from 8 out of 10 to 5 out of 10. EKG showed no ischemic changes, Chest Xray was  independently reviewed by me and shows no pneumothorax, no pulmonary edema or infiltrate. I agree with radiology interpretation.   They were given Toradol for their symptoms, and on repeat evaluation their symptoms are ***  I considered a broad differential including but not limited to ACS, PE, Dissection, pneumothorax, costochondritis, pneumonia, GERD, pericarditis, and pericardial effusion.  PE is considered low risk and I have very low clinical gestalt for this, no history of VTE no calf pain or swelling, no pleuritic pain no hypoxia or tachycardia.  He is on hormones and no history of cancer.  I do not feel further workup is indicated at this time  I feel disssection is very unlikely  Symptoms and EKG are not consistent with pericarditis  Their heart score is 3. Troponins show ***  Plan is for NSAIDs for pain.  Given age and history of CVA will refer to cardiology for outpatient follow-up.  Patient informed that he is low risk for Mace but not 0 risk so if his symptoms worsen or change he needs to come back to the ER right away.   Amount and/or Complexity of Data Reviewed Labs: ordered. Radiology: ordered.  Risk Prescription drug management.   ***  {Document critical care time when appropriate:1} {Document review of labs and clinical decision tools ie heart score, Chads2Vasc2 etc:1}  {Document your independent review of radiology images, and any outside records:1} {Document your discussion with family members, caretakers, and with consultants:1} {Document social determinants of health affecting pt's care:1} {Document your decision making why or why not admission, treatments were needed:1} Final Clinical Impression(s) / ED Diagnoses Final diagnoses:  None    Rx / DC Orders ED Discharge Orders     None

## 2022-11-24 NOTE — Discharge Instructions (Addendum)
You were seen in the emergency department today complaining of left-sided chest pain that started at work.  Fortunately your workup was reassuring.  Your EKG, chest x-ray and troponin levels were normal.  Your pain is reproducible with moving your arm and pushing on your chest so I think this is possibly due to a muscle strain.  We are prescribing you medication to help with pain including a topical patch that helps with numbing and anti-inflammatories.  I have referred you to cardiology for follow-up but because you do have history of stroke and hypertension.  Come back to the ER if you have any new or worsening symptoms.

## 2022-11-24 NOTE — ED Triage Notes (Signed)
Pt is spanish speaking only  Left sided chest pain started at 12 today  Radiates down LEFT arm and through to shoulder  Denies SOB  Hx of HTN and CVA 3 years ago

## 2023-01-26 NOTE — Progress Notes (Signed)
 CARDIOLOGY CONSULT NOTE       Patient ID: Frank Carr MRN: [id number] DOB/AGE: 1960/07/23 62 y.o.  Referring Physician: [facility] Sherran Barks [place] Primary Physician: Shona Norleen PEDLAR, MD Primary Cardiologist: New Reason for Consultation: Chest pain   HPI:  62 y.o. referred by [facility] for chest pain Seen in [facility] ED 11/25/22 History of HTN and CVA. He works at [employer] Mid day at work had left upper chest pain radiating to sholder and arm. Worse with movement of arm He has to pick up heavy items routinely at work. No associated dyspnea, palpitations or diaphoresis . R/O ECG with no acute ST changes only borderline voltage LVH. CXR NAD no CE.   Has had some dysphagia in past with distal biopsy showing Barrett's esophagus. GERD Rx with omeprazole    Sedentary From [place] Has 4 kids Works at [employer] in town. Has been ok since October Seems to have some arthritis in sholders   ROS All other systems reviewed and negative except as noted above  Past Medical History:  Diagnosis Date   Abdominal pain 06/09/2010   Arthritis    left knee   Diarrhea 06/09/2010   Flank pain 06/09/2010   Hypothyroid    Rectal bleeding 06/09/2010   Vomiting 06/09/2010    Family History  Problem Relation Age of Onset   Anesthesia problems Neg Hx    Hypotension Neg Hx    Malignant hyperthermia Neg Hx    Pseudochol deficiency Neg Hx     Social History   Socioeconomic History   Marital status: Married    Spouse name: Not on file   Number of children: Not on file   Years of education: Not on file   Highest education level: Not on file  Occupational History   Not on file  Tobacco Use   Smoking status: Never   Smokeless tobacco: Never  Vaping Use   Vaping status: Never Used  Substance and Sexual Activity   Alcohol use: Yes    Comment: Drinks 1-2 drinks a month (beer)   Drug use: No   Sexual activity: Yes  Other Topics Concern   Not on file  Social History Narrative   Not on  file   Social Drivers of Health   Financial Resource Strain: Not on file  Food Insecurity: Not on file  Transportation Needs: Not on file  Physical Activity: Not on file  Stress: Not on file  Social Connections: Unknown (06/08/2021)   Received from [facility]   Social Network    Social Network: Not on file  Intimate Partner Violence: Unknown (04/30/2021)   Received from [facility]   HITS    Physically Hurt: Not on file    Insult or Talk Down To: Not on file    Threaten Physical Harm: Not on file    Scream or Curse: Not on file    Past Surgical History:  Procedure Laterality Date   COLONOSCOPY  09/19/2010   Procedure: COLONOSCOPY;  Surgeon: Claudis RAYMOND Rivet, MD;  [facility];  Service: Endoscopy;  Laterality: N/A;  10:00   COLONOSCOPY N/A 04/05/2012   Procedure: COLONOSCOPY;  Surgeon: Oneil DELENA Budge, MD;  [facility];  Service: Gastroenterology;  Laterality: N/A;   ESOPHAGEAL DILATION N/A 06/27/2014   Procedure: ESOPHAGEAL DILATION;  Surgeon: Claudis RAYMOND Rivet, MD;  [facility];  Service: Endoscopy;  Laterality: N/A;   ESOPHAGOGASTRODUODENOSCOPY N/A 06/27/2014   Procedure:  ESOPHAGOGASTRODUODENOSCOPY (EGD);  Surgeon: Claudis RAYMOND Rivet, MD;  [facility];  Service: Endoscopy;  Laterality: N/A;  1255   GANGLION CYST EXCISION     left wrist   KNEE ARTHROSCOPY     left knee      Current Outpatient Medications:    atorvastatin (LIPITOR) 10 MG tablet, Take 10 mg by mouth daily., Disp: , Rfl:    levothyroxine (SYNTHROID, LEVOTHROID) 75 MCG tablet, Take 1 tablet by mouth daily., Disp: , Rfl: 3   lidocaine  (LIDODERM ) 5 %, Place 1 patch onto the skin daily. Remove & Discard patch within 12 hours or as directed by MD, Disp: 30 patch, Rfl: 0   losartan (COZAAR) 50 MG tablet, Take 1 tablet by mouth daily., Disp: , Rfl: 3   omeprazole  (PRILOSEC) 40 MG capsule, TAKE 1 CAPSULE(40 MG) BY MOUTH DAILY, Disp: 90 capsule, Rfl:  3   aspirin 81 MG tablet, Take 81 mg by mouth daily. (Patient not taking: Reported on 02/09/2023), Disp: , Rfl:    cetirizine  (ZYRTEC  ALLERGY) 10 MG tablet, Take 1 tablet (10 mg total) by mouth daily. (Patient not taking: Reported on 02/09/2023), Disp: 30 tablet, Rfl: 0   pseudoephedrine  (SUDAFED) 30 MG tablet, Take 1 tablet (30 mg total) by mouth every 8 (eight) hours as needed for congestion. (Patient not taking: Reported on 02/09/2023), Disp: 30 tablet, Rfl: 0    Physical Exam: Blood pressure 132/84, pulse 64, height 5' (1.524 m), weight 180 lb (81.6 kg), SpO2 97%.    Affect appropriate Healthy:  appears stated age HEENT: normal Neck supple with no adenopathy JVP normal no bruits no thyromegaly Lungs clear with no wheezing and good diaphragmatic motion Heart:  S1/S2 no murmur, no rub, gallop or click PMI normal Abdomen: benighn, BS positve, no tenderness, no AAA no bruit.  No HSM or HJR Distal pulses intact with no bruits No edema Neuro non-focal Skin warm and dry No muscular weakness   Labs:   Lab Results  Component Value Date   WBC 6.8 11/24/2022   HGB 14.5 11/24/2022   HCT 41.9 11/24/2022   MCV 88.6 11/24/2022   PLT 261 11/24/2022   No results for input(s): NA, K, CL, CO2, BUN, CREATININE, CALCIUM, PROT, BILITOT, ALKPHOS, ALT, AST, GLUCOSE in the last 168 hours.  Invalid input(s): LABALBU No results found for: CKTOTAL, CKMB, CKMBINDEX, TROPONINI No results found for: CHOL No results found for: HDL No results found for: LDLCALC No results found for: TRIG No results found for: CHOLHDL No results found for: LDLDIRECT    Radiology: No results found.  EKG: SR rate 62 normal    ASSESSMENT AND PLAN:   Chest Pain:  atypical sounds muscular. ECG with no acute ST segment changes improves with NSAI's. Shared decision making will risk stratify with Ex stress myovue and echo  GERD: continue omeprazole  f/u GI Rehman for  Barrett's surveillance HTN:   continue losartan and low sodium DASH type diet Thyroid:  continue synthroid replacement TSH with primary   Echo Ex Myovue   F/U PRN pending testing   Signed: Maude Emmer 02/09/2023, 9:20 AM

## 2023-02-09 ENCOUNTER — Ambulatory Visit: Payer: BC Managed Care – PPO | Attending: Cardiovascular Disease | Admitting: Cardiovascular Disease

## 2023-02-09 ENCOUNTER — Encounter: Payer: Self-pay | Admitting: *Deleted

## 2023-02-09 ENCOUNTER — Encounter: Payer: Self-pay | Admitting: Cardiovascular Disease

## 2023-02-09 VITALS — BP 132/84 | HR 64 | Ht 60.0 in | Wt 180.0 lb

## 2023-02-09 DIAGNOSIS — R0602 Shortness of breath: Secondary | ICD-10-CM

## 2023-02-09 DIAGNOSIS — R079 Chest pain, unspecified: Secondary | ICD-10-CM | POA: Diagnosis not present

## 2023-02-09 NOTE — Patient Instructions (Signed)
 Medication Instructions:  Your physician recommends that you continue on your current medications as directed. Please refer to the Current Medication list given to you today.  *If you need a refill on your cardiac medications before your next appointment, please call your pharmacy*   Lab Work: NONE   If you have labs (blood work) drawn today and your tests are completely normal, you will receive your results only by: MyChart Message (if you have MyChart) OR A paper copy in the mail If you have any lab test that is abnormal or we need to change your treatment, we will call you to review the results.   Testing/Procedures: Your physician has requested that you have en exercise stress myoview . For further information please visit [email or web address] Please follow instruction sheet, as given.   Your physician has requested that you have an echocardiogram. Echocardiography is a painless test that uses sound waves to create images of your heart. It provides your doctor with information about the size and shape of your heart and how well your heart's chambers and valves are working. This procedure takes approximately one hour. There are no restrictions for this procedure. Please do NOT wear cologne, perfume, aftershave, or lotions (deodorant is allowed). Please arrive 15 minutes prior to your appointment time.  Please note: We ask at that you not bring children with you during ultrasound (echo/ vascular) testing. Due to room size and safety concerns, children are not allowed in the ultrasound rooms during exams. Our front office staff cannot provide observation of children in our lobby area while testing is being conducted. An adult accompanying a patient to their appointment will only be allowed in the ultrasound room at the discretion of the ultrasound technician under special circumstances. We apologize for any inconvenience.    Follow-Up: At [facility], you and your health needs  are our priority.  As part of our continuing mission to provide you with exceptional heart care, we have created designated Provider Care Teams.  These Care Teams include your primary Cardiologist (physician) and Advanced Practice Providers (APPs -  Physician Assistants and Nurse Practitioners) who all work together to provide you with the care you need, when you need it.  We recommend signing up for the patient portal called MyChart.  Sign up information is provided on this After Visit Summary.  MyChart is used to connect with patients for Virtual Visits (Telemedicine).  Patients are able to view lab/test results, encounter notes, upcoming appointments, etc.  Non-urgent messages can be sent to your provider as well.   To learn more about what you can do with MyChart, go to [email or web address].    Your next appointment:    As Needed   Provider:   Maude Emmer, MD    Other Instructions Thank you for choosing [facility]!

## 2023-02-24 ENCOUNTER — Encounter (HOSPITAL_COMMUNITY): Payer: Self-pay

## 2023-02-24 ENCOUNTER — Encounter (HOSPITAL_COMMUNITY): Payer: BC Managed Care – PPO

## 2023-02-24 ENCOUNTER — Ambulatory Visit (HOSPITAL_COMMUNITY)
Admission: RE | Admit: 2023-02-24 | Discharge: 2023-02-24 | Disposition: A | Discharge status: Home / Self Care | Payer: BC Managed Care – PPO | Source: Ambulatory Visit | Attending: Cardiovascular Disease | Admitting: Cardiovascular Disease

## 2023-02-24 ENCOUNTER — Ambulatory Visit (HOSPITAL_BASED_OUTPATIENT_CLINIC_OR_DEPARTMENT_OTHER)
Admission: RE | Admit: 2023-02-24 | Discharge: 2023-02-24 | Disposition: A | Discharge status: Home / Self Care | Payer: BC Managed Care – PPO | Source: Ambulatory Visit | Attending: Cardiovascular Disease | Admitting: Cardiovascular Disease

## 2023-02-24 DIAGNOSIS — R079 Chest pain, unspecified: Secondary | ICD-10-CM

## 2023-02-24 DIAGNOSIS — R0602 Shortness of breath: Secondary | ICD-10-CM | POA: Diagnosis not present

## 2023-02-24 LAB — ECHOCARDIOGRAM COMPLETE
AR max vel: 2.42 cm2
AV Area VTI: 2.42 cm2
AV Area mean vel: 2.5 cm2
AV Mean grad: 3.7 mmHg
AV Peak grad: 7.7 mmHg
Ao pk vel: 1.39 m/s
Area-P 1/2: 3.08 cm2
S' Lateral: 2.7 cm

## 2023-02-24 MED ORDER — TECHNETIUM TC 99M TETROFOSMIN IV KIT
10.0000 | PACK | Freq: Once | INTRAVENOUS | Status: AC | PRN
  Administered 2023-02-24: 10.8 via INTRAVENOUS

## 2023-02-24 MED ORDER — TECHNETIUM TC 99M TETROFOSMIN IV KIT
30.0000 | PACK | Freq: Once | INTRAVENOUS | Status: AC | PRN
  Administered 2023-02-24: 32 via INTRAVENOUS

## 2023-02-24 MED ORDER — REGADENOSON 0.4 MG/5ML IV SOLN
INTRAVENOUS | Status: AC
  Filled 2023-02-24: qty 5

## 2023-02-24 NOTE — Progress Notes (Signed)
*  PRELIMINARY RESULTS* Echocardiogram 2D Echocardiogram has been performed.  Stacey Drain 02/24/2023, 9:15 AM

## 2023-02-25 LAB — NM MYOCAR MULTI W/SPECT W/WALL MOTION / EF
Angina Index: 1
Duke Treadmill Score: 3
Estimated workload: 9.8
Exercise duration (min): 7 min
Exercise duration (sec): 15 s
LV dias vol: 91 mL (ref 62–150)
LV sys vol: 28 mL
MPHR: 159 {beats}/min
Nuc Stress EF: 70 %
Peak HR: 139 {beats}/min
Percent HR: 87 %
RATE: 0.3
RPE: 13
Rest HR: 61 {beats}/min
Rest Nuclear Isotope Dose: 10.8 mCi
SDS: 1
SRS: 0
SSS: 1
ST Depression (mm): 0 mm
Stress Nuclear Isotope Dose: 32 mCi
TID: 1.09

## 2023-03-11 DIAGNOSIS — I10 Essential (primary) hypertension: Secondary | ICD-10-CM | POA: Diagnosis not present

## 2023-03-11 DIAGNOSIS — Z6834 Body mass index (BMI) 34.0-34.9, adult: Secondary | ICD-10-CM | POA: Diagnosis not present

## 2023-03-11 DIAGNOSIS — E039 Hypothyroidism, unspecified: Secondary | ICD-10-CM | POA: Diagnosis not present

## 2023-04-15 ENCOUNTER — Other Ambulatory Visit (HOSPITAL_COMMUNITY): Payer: Self-pay | Admitting: Family Medicine

## 2023-04-15 DIAGNOSIS — I10 Essential (primary) hypertension: Secondary | ICD-10-CM | POA: Diagnosis not present

## 2023-04-15 DIAGNOSIS — K219 Gastro-esophageal reflux disease without esophagitis: Secondary | ICD-10-CM | POA: Diagnosis not present

## 2023-04-15 DIAGNOSIS — E785 Hyperlipidemia, unspecified: Secondary | ICD-10-CM

## 2023-04-15 DIAGNOSIS — E782 Mixed hyperlipidemia: Secondary | ICD-10-CM | POA: Diagnosis not present

## 2023-04-15 DIAGNOSIS — E039 Hypothyroidism, unspecified: Secondary | ICD-10-CM | POA: Diagnosis not present

## 2023-04-15 DIAGNOSIS — E876 Hypokalemia: Secondary | ICD-10-CM | POA: Diagnosis not present

## 2023-04-20 ENCOUNTER — Encounter: Payer: Self-pay | Admitting: *Deleted

## 2023-05-04 ENCOUNTER — Ambulatory Visit (HOSPITAL_COMMUNITY)
Admission: RE | Admit: 2023-05-04 | Discharge: 2023-05-04 | Disposition: A | Discharge status: Home / Self Care | Payer: Self-pay | Source: Ambulatory Visit | Attending: Family Medicine | Admitting: Family Medicine

## 2023-05-04 DIAGNOSIS — E785 Hyperlipidemia, unspecified: Secondary | ICD-10-CM | POA: Insufficient documentation

## 2023-05-13 ENCOUNTER — Telehealth: Payer: Self-pay | Admitting: *Deleted

## 2023-05-13 NOTE — Telephone Encounter (Signed)
  Procedure: COLONOSCOPY  Estimated body mass index is 35.15 kg/m as calculated from the following:   Height as of 02/09/23: 5' (1.524 m).   Weight as of 02/09/23: 180 lb (81.6 kg).  Have you had a colonoscopy before?  2014, Dr. Larrie Po  Do you have family history of colon cancer?  no  Do you have a family history of polyps? no  Previous colonoscopy with polyps removed? no  Do you have a history colorectal cancer?   no  Are you diabetic?  no  Do you have a prosthetic or mechanical heart valve? no  Do you have a pacemaker/defibrillator?   no  Have you had endocarditis/atrial fibrillation?  no  Do you use supplemental oxygen/CPAP?  no  Have you had joint replacement within the last 12 months?  no  Do you tend to be constipated or have to use laxatives?  no   Do you have history of alcohol use? If yes, how much and how often.  no  Do you have history or are you using drugs? If yes, what do are you  using?  no  Have you ever had a stroke/heart attack?  no  Have you ever had a heart or other vascular stent placed,?no  Do you take weight loss medication? no  Do you take any blood-thinning medications such as: (Plavix, aspirin, Coumadin, Aggrenox, Brilinta, Xarelto, Eliquis, Pradaxa, Savaysa or Effient)? no  If yes we need the name, milligram, dosage and who is prescribing doctor:               Current Outpatient Medications  Medication Sig Dispense Refill   Potassium Chloride ER 20 MEQ TBCR Take 1 tablet by mouth daily.     atorvastatin (LIPITOR) 10 MG tablet Take 10 mg by mouth daily.     levothyroxine (SYNTHROID, LEVOTHROID) 75 MCG tablet Take 1 tablet by mouth daily.  3   losartan (COZAAR) 50 MG tablet Take 1 tablet by mouth daily.  3   omeprazole (PRILOSEC) 40 MG capsule TAKE 1 CAPSULE(40 MG) BY MOUTH DAILY 90 capsule 3   oxyCODONE-acetaminophen (PERCOCET) 10-325 MG tablet Take 1 tablet by mouth 3 (three) times daily.     No current facility-administered medications  for this visit.    No Known Allergies

## 2023-06-02 NOTE — Telephone Encounter (Signed)
 Normal colonoscopy in 2014 by Dr. Larrie Po.  Hx of Barrett's it appears. I don't see where this has been followed. Recommend office visit to cover all the bases.

## 2023-07-01 ENCOUNTER — Ambulatory Visit: Admitting: Gastroenterology

## 2023-08-03 ENCOUNTER — Ambulatory Visit (INDEPENDENT_AMBULATORY_CARE_PROVIDER_SITE_OTHER): Admitting: Gastroenterology

## 2023-08-03 ENCOUNTER — Encounter: Payer: Self-pay | Admitting: Gastroenterology

## 2023-08-03 VITALS — BP 130/84 | HR 80 | Temp 97.8°F | Ht 60.0 in | Wt 183.1 lb

## 2023-08-03 DIAGNOSIS — K2270 Barrett's esophagus without dysplasia: Secondary | ICD-10-CM | POA: Insufficient documentation

## 2023-08-03 DIAGNOSIS — Z8719 Personal history of other diseases of the digestive system: Secondary | ICD-10-CM

## 2023-08-03 DIAGNOSIS — K219 Gastro-esophageal reflux disease without esophagitis: Secondary | ICD-10-CM

## 2023-08-03 DIAGNOSIS — Z1211 Encounter for screening for malignant neoplasm of colon: Secondary | ICD-10-CM | POA: Insufficient documentation

## 2023-08-03 NOTE — Progress Notes (Signed)
 Gastroenterology Office Note    Referring Provider: Shona Norleen PEDLAR, MD Primary Care Physician:  Shona Norleen PEDLAR, MD  Primary GI: Dr. Eartha    Chief Complaint   Chief Complaint  Patient presents with   Colonoscopy    Pt arrives to discuss follow up colonoscopy. Last one completed in 2014 with Dr.Jenkins. no issues. Family would like TCS to be scheduled in August.      History of Present Illness   Frank Carr is a 63 y.o. male presenting today with a history of GERD, Barrett's with last EGD in 2016, and need for screening colonoscopy. His wife is with him today. Interpreter present as he is Spanish-speaking.  No abdominal pain, N/V, changes in bowel habits, constipation, diarrhea, overt GI bleeding, dysphagia, unexplained weight loss, lack of appetite, unexplained weight gain.   He is taking omeprazole  40 mg daily. GERD overall controlled unless he eats spicy foods.   No other GI concerns.    EGD June 2016: Barrett's esophagus, erosive gastritis, dilation.   Colonoscopy 2014: normal  No FH colon cancer or polyps  Past Medical History:  Diagnosis Date   Abdominal pain 06/09/2010   Arthritis    left knee   Diarrhea 06/09/2010   Flank pain 06/09/2010   Hypothyroid    Rectal bleeding 06/09/2010   Vomiting 06/09/2010    Past Surgical History:  Procedure Laterality Date   COLONOSCOPY  09/19/2010   Procedure: COLONOSCOPY;  Surgeon: Claudis RAYMOND Rivet, MD;  [facility];  Service: Endoscopy;  Laterality: N/A;  10:00   COLONOSCOPY N/A 04/05/2012   Procedure: COLONOSCOPY;  Surgeon: Oneil DELENA Budge, MD;  [facility];  Service: Gastroenterology;  Laterality: N/A;   ESOPHAGEAL DILATION N/A 06/27/2014   Procedure: ESOPHAGEAL DILATION;  Surgeon: Claudis RAYMOND Rivet, MD;  [facility];  Service: Endoscopy;  Laterality: N/A;   ESOPHAGOGASTRODUODENOSCOPY N/A 06/27/2014   Procedure: ESOPHAGOGASTRODUODENOSCOPY (EGD);  Surgeon: Claudis RAYMOND Rivet, MD;   [facility];  Service: Endoscopy;  Laterality: N/A;  1255   GANGLION CYST EXCISION     left wrist   KNEE ARTHROSCOPY     left knee    Current Outpatient Medications  Medication Sig Dispense Refill   atorvastatin (LIPITOR) 10 MG tablet Take 10 mg by mouth daily.     levothyroxine (SYNTHROID, LEVOTHROID) 75 MCG tablet Take 1 tablet by mouth daily.  3   losartan (COZAAR) 50 MG tablet Take 1 tablet by mouth daily.  3   omeprazole  (PRILOSEC) 40 MG capsule TAKE 1 CAPSULE(40 MG) BY MOUTH DAILY 90 capsule 3   oxyCODONE-acetaminophen (PERCOCET) 10-325 MG tablet Take 1 tablet by mouth 3 (three) times daily.     Potassium Chloride  ER 20 MEQ TBCR Take 1 tablet by mouth daily.     No current facility-administered medications for this visit.    Allergies as of 08/03/2023   (No Known Allergies)    Family History  Problem Relation Age of Onset   Anesthesia problems Neg Hx    Hypotension Neg Hx    Malignant hyperthermia Neg Hx    Pseudochol deficiency Neg Hx     Social History   Socioeconomic History   Marital status: Married    Spouse name: Not on file   Number of children: Not on file   Years of education: Not on file   Highest education level: Not on file  Occupational History   Not on file  Tobacco Use   Smoking status: Never   Smokeless tobacco: Never  Vaping Use   Vaping status: Never Used  Substance and Sexual Activity   Alcohol use: Yes    Comment: Drinks 1-2 drinks a month (beer)   Drug use: No   Sexual activity: Yes  Other Topics Concern   Not on file  Social History Narrative   Not on file   Social Drivers of Health   Financial Resource Strain: Not on file  Food Insecurity: Not on file  Transportation Needs: Not on file  Physical Activity: Not on file  Stress: Not on file  Social Connections: Unknown (06/08/2021)   Received from [facility]   Social Network    Social Network: Not on file  Intimate Partner Violence: Unknown (04/30/2021)    Received from [facility]   HITS    Physically Hurt: Not on file    Insult or Talk Down To: Not on file    Threaten Physical Harm: Not on file    Scream or Curse: Not on file     Review of Systems   Gen: Denies any fever, chills, fatigue, weight loss, lack of appetite.  CV: Denies chest pain, heart palpitations, peripheral edema, syncope.  Resp: Denies shortness of breath at rest or with exertion. Denies wheezing or cough.  GI: Denies dysphagia or odynophagia. Denies jaundice, hematemesis, fecal incontinence. GU : Denies urinary burning, urinary frequency, urinary hesitancy MS: Denies joint pain, muscle weakness, cramps, or limitation of movement.  Derm: Denies rash, itching, dry skin Psych: Denies depression, anxiety, memory loss, and confusion Heme: Denies bruising, bleeding, and enlarged lymph nodes.   Physical Exam   BP 130/84   Pulse 80   Temp 97.8 F (36.6 C)   Ht 5' (1.524 m)   Wt 183 lb 1.6 oz (83.1 kg)   BMI 35.76 kg/m  General:   Alert and oriented. Pleasant and cooperative. Well-nourished and well-developed.  Head:  Normocephalic and atraumatic. Eyes:  Without icterus Ears:  Normal auditory acuity. Lungs:  Clear to auscultation bilaterally.  Heart:  S1, S2 present without murmurs appreciated.  Abdomen:  +BS, soft, non-tender and non-distended. No HSM noted. No guarding or rebound. No masses appreciated.  Rectal:  Deferred  Msk:  Symmetrical without gross deformities. Normal posture. Extremities:  Without edema. Neurologic:  Alert and  oriented x4;  grossly normal neurologically. Skin:  Intact without significant lesions or rashes. Psych:  Alert and cooperative. Normal mood and affect.   Assessment   Frank Carr is a 63 y.o. male presenting todaywith a history of GERD, Barrett's with last EGD in 2016, and need for screening colonoscopy, as last was in 2014 and normal. Spanish interpreter present during entirety of visit.   GERD, with hx of  Barrett's: overdue for Barrett's surveillance. He has no alarm signs/symptoms. GERD overall controlled while on omeprazole  daily. Discussed taking this indefinitely due to history.   Screening colonoscopy: no concerning lower GI signs/symptoms, no prior history of polyps, no family history of colon cancer or polyps.    PLAN    Proceed with colonoscopy by Dr. Eartha in near future: the risks, benefits, and alternatives have been discussed with the patient in detail. The patient states understanding and desires to proceed.   Continue omeprazole  40 mg daily  GERD handout attached  Further recommendations to follow.   Therisa MICAEL Stager, PhD, ANP-BC [facility] Gastroenterology   I have reviewed the note and agree with the APP's assessment as described  in this progress note  We will also discuss with patient proceeding with EGD at the same time of colonoscopy as he has history of BE and is overdue for this.  Toribio Fortune, MD Gastroenterology and Hepatology [facility] Gastroenterology

## 2023-08-03 NOTE — Patient Instructions (Addendum)
 Prximamente programaremos una colonoscopia y ignacia endoscopia digestiva superior con el Dr. Eartha.  Contine tomando omeprazol una vez por la maana, 30 minutos antes del desayuno.  Consulte el folleto sobre el reflujo!   Fue un Arboriculturist. Quiero crear relaciones de confianza con los pacientes y brindar una atencin genuina, guadeloupe y de calidad. Valoro mucho tus comentarios, as que espera una encuesta sobre tu visita de hoy. Agradezco tu tiempo para completarla! Therisa MICAEL Stager, PhD, ANP-BC     We are arranging a colonoscopy and upper endoscopy in the near future with Dr. Eartha.  Continue taking omeprazole  once each morning, 30 minutes before breakfast.   Please see the reflux handout!  It was a pleasure to see you today. I want to create trusting relationships with patients and provide genuine, compassionate, and quality care. I truly value your feedback, so please be on the lookout for a survey regarding your visit with me today. I appreciate your time in completing this!         Therisa MICAEL Stager, PhD, ANP-BC [facility] Gastroenterology

## 2023-08-05 ENCOUNTER — Telehealth: Payer: Self-pay | Admitting: *Deleted

## 2023-08-05 MED ORDER — PEG 3350-KCL-NA BICARB-NACL 420 G PO SOLR: 4000.0000 mL | Freq: Once | ORAL | 0 refills | Status: AC

## 2023-08-05 NOTE — Telephone Encounter (Signed)
 Called pt and spoke with spouse Deana with pt on the phone also. Scheduled for TCS/EGD with Dr. Castaneda (after 8/10 per encounter form) on 8/20. Aware will send rx for prep to [facility] and will mail instructions to him. Confirmed address.

## 2023-08-09 NOTE — Telephone Encounter (Signed)
 Called [employer] and no PA required for procedures Ref# [id number]

## 2023-09-15 ENCOUNTER — Ambulatory Visit (HOSPITAL_COMMUNITY): Payer: Self-pay | Admitting: Anesthesiology

## 2023-09-15 ENCOUNTER — Other Ambulatory Visit: Payer: Self-pay

## 2023-09-15 ENCOUNTER — Encounter (HOSPITAL_COMMUNITY)
Admission: RE | Disposition: A | Discharge status: Home / Self Care | Payer: Self-pay | Source: Home / Self Care | Attending: Gastroenterology

## 2023-09-15 ENCOUNTER — Encounter (HOSPITAL_COMMUNITY): Payer: Self-pay | Admitting: Gastroenterology

## 2023-09-15 ENCOUNTER — Ambulatory Visit (HOSPITAL_COMMUNITY)
Admission: RE | Admit: 2023-09-15 | Discharge: 2023-09-15 | Disposition: A | Discharge status: Home / Self Care | Attending: Gastroenterology | Admitting: Gastroenterology

## 2023-09-15 DIAGNOSIS — E039 Hypothyroidism, unspecified: Secondary | ICD-10-CM | POA: Insufficient documentation

## 2023-09-15 DIAGNOSIS — K648 Other hemorrhoids: Secondary | ICD-10-CM | POA: Insufficient documentation

## 2023-09-15 DIAGNOSIS — K2270 Barrett's esophagus without dysplasia: Secondary | ICD-10-CM | POA: Insufficient documentation

## 2023-09-15 DIAGNOSIS — I10 Essential (primary) hypertension: Secondary | ICD-10-CM | POA: Insufficient documentation

## 2023-09-15 DIAGNOSIS — K5730 Diverticulosis of large intestine without perforation or abscess without bleeding: Secondary | ICD-10-CM | POA: Insufficient documentation

## 2023-09-15 DIAGNOSIS — K449 Diaphragmatic hernia without obstruction or gangrene: Secondary | ICD-10-CM | POA: Insufficient documentation

## 2023-09-15 DIAGNOSIS — K621 Rectal polyp: Secondary | ICD-10-CM | POA: Diagnosis not present

## 2023-09-15 DIAGNOSIS — K635 Polyp of colon: Secondary | ICD-10-CM | POA: Insufficient documentation

## 2023-09-15 DIAGNOSIS — Z1211 Encounter for screening for malignant neoplasm of colon: Secondary | ICD-10-CM | POA: Insufficient documentation

## 2023-09-15 DIAGNOSIS — K219 Gastro-esophageal reflux disease without esophagitis: Secondary | ICD-10-CM | POA: Insufficient documentation

## 2023-09-15 HISTORY — PX: COLONOSCOPY: SHX5424

## 2023-09-15 HISTORY — PX: ESOPHAGOGASTRODUODENOSCOPY: SHX5428

## 2023-09-15 LAB — HM COLONOSCOPY

## 2023-09-15 SURGERY — COLONOSCOPY
Anesthesia: General

## 2023-09-15 MED ORDER — LACTATED RINGERS IV SOLN: INTRAVENOUS | Status: DC

## 2023-09-15 MED ORDER — PHENYLEPHRINE 80 MCG/ML (10ML) SYRINGE FOR IV PUSH (FOR BLOOD PRESSURE SUPPORT)
PREFILLED_SYRINGE | INTRAVENOUS | Status: DC | PRN
  Administered 2023-09-15 (×2): 80 ug via INTRAVENOUS

## 2023-09-15 MED ORDER — PROPOFOL 500 MG/50ML IV EMUL
INTRAVENOUS | Status: DC | PRN
Start: 2023-09-15 — End: 2023-09-15
  Administered 2023-09-15: 100 ug/kg/min via INTRAVENOUS
  Administered 2023-09-15: 120 mg via INTRAVENOUS
  Administered 2023-09-15: 80 mg via INTRAVENOUS

## 2023-09-15 MED ORDER — LIDOCAINE 2% (20 MG/ML) 5 ML SYRINGE
INTRAMUSCULAR | Status: DC | PRN
Start: 2023-09-15 — End: 2023-09-15
  Administered 2023-09-15: 60 mg via INTRAVENOUS

## 2023-09-15 NOTE — Transfer of Care (Signed)
 Immediate Anesthesia Transfer of Care Note  Patient: Frank Carr  Procedure(s) Performed: COLONOSCOPY EGD (ESOPHAGOGASTRODUODENOSCOPY)  Patient Location: Endoscopy Unit  Anesthesia Type:General  Level of Consciousness: drowsy  Airway & Oxygen Therapy: Patient Spontanous Breathing  Post-op Assessment: Report given to RN and Post -op Vital signs reviewed and stable  Post vital signs: Reviewed and stable  Last Vitals:  Vitals Value Taken Time  BP 99/56 09/15/23 08:08  Temp 36.5 C 09/15/23 08:08  Pulse 61 09/15/23 08:08  Resp 19 09/15/23 08:08  SpO2 95 % 09/15/23 08:08    Last Pain:  Vitals:   09/15/23 0808  TempSrc: Axillary  PainSc:       Patients Stated Pain Goal: 6 (09/15/23 0703)  Complications: No notable events documented.

## 2023-09-15 NOTE — Anesthesia Procedure Notes (Signed)
 Date/Time: 09/15/2023 7:30 AM  Performed by: Barbarann Verneita RAMAN, CRNAPre-anesthesia Checklist: Patient identified, Emergency Drugs available, Suction available, Timeout performed and Patient being monitored Patient Re-evaluated:Patient Re-evaluated prior to induction Oxygen Delivery Method: Nasal Cannula

## 2023-09-15 NOTE — Op Note (Signed)
 [facility] Patient Name: Frank Carr Procedure Date: 09/15/2023 7:07 AM MRN: [id number] Date of Birth: 26-Apr-1960 Attending MD: Toribio Fortune , , [id number] CSN: [id number] Age: 63 Admit Type: Outpatient Procedure:                Colonoscopy Indications:              Screening for colorectal malignant neoplasm Providers:                Toribio Fortune, Crystal Page, Bascom Blush Referring MD:              Medicines:                Monitored Anesthesia Care Complications:            No immediate complications. Estimated Blood Loss:     Estimated blood loss: none. Procedure:                Pre-Anesthesia Assessment:                           - Prior to the procedure, a History and Physical                            was performed, and patient medications, allergies                            and sensitivities were reviewed. The patient's                            tolerance of previous anesthesia was reviewed.                           - The risks and benefits of the procedure and the                            sedation options and risks were discussed with the                            patient. All questions were answered and informed                            consent was obtained.                           - ASA Grade Assessment: II - A patient with mild                            systemic disease.                           After obtaining informed consent, the colonoscope                            was passed under direct vision. Throughout the                            procedure, the patient's blood pressure,  pulse, and                            oxygen saturations were monitored continuously. The                            [id number]-[id number] ([id number]) Peds Colon was introduced                            through the anus and advanced to the the terminal                            ileum. The colonoscopy was performed without                            difficulty. The  patient tolerated the procedure                            well. The quality of the bowel preparation was                            excellent. Scope In: 7:50:33 AM Scope Out: 8:03:59 AM Scope Withdrawal Time: 0 hours 11 minutes 32 seconds  Total Procedure Duration: 0 hours 13 minutes 26 seconds  Findings:      The perianal and digital rectal examinations were normal.      The terminal ileum appeared normal.      A few medium-mouthed diverticula were found in the sigmoid colon.      A 2 mm polyp was found in the rectum. The polyp was sessile. The polyp       was removed with a cold snare. Resection and retrieval were complete.      Non-bleeding internal hemorrhoids were found during retroflexion. The       hemorrhoids were small. Impression:               - The examined portion of the ileum was normal.                           - Diverticulosis in the sigmoid colon.                           - One 2 mm polyp in the rectum, removed with a cold                            snare. Resected and retrieved.                           - Non-bleeding internal hemorrhoids. Moderate Sedation:      Per Anesthesia Care Recommendation:           - Discharge patient to home (ambulatory).                           - Resume previous diet.                           - Await  pathology results.                           - Repeat colonoscopy for surveillance based on                            pathology results. Procedure Code(s):        --- Professional ---                           [id number], Colonoscopy, flexible; with removal of                            tumor(s), polyp(s), or other lesion(s) by snare                            technique Diagnosis Code(s):        --- Professional ---                           Z12.11, Encounter for screening for malignant                            neoplasm of colon                           K64.8, Other hemorrhoids                           D12.8, Benign neoplasm of  rectum                           K57.30, Diverticulosis of large intestine without                            perforation or abscess without bleeding CPT copyright 2022 American Medical Association. All rights reserved. The codes documented in this report are preliminary and upon coder review may  be revised to meet current compliance requirements. Toribio Fortune, MD Toribio Fortune,  09/15/2023 8:07:43 AM This report has been signed electronically. Number of Addenda: 0

## 2023-09-15 NOTE — H&P (Signed)
 Frank Carr is an 63 y.o. male.   Chief Complaint: Barrett's esophagus and CRC screening HPI: 63 year old male with past medical history GERD, Barrett's , coming for evaluation Barrett's esophagus and CRC screening  The patient denies having any dysphagia, heartburn, nausea, vomiting, fever, chills, hematochezia, melena, hematemesis, abdominal distention, abdominal pain, diarrhea, jaundice, pruritus or weight loss.  Takes omeprazole  daily.  No family history of colon cancer.  Last colonoscopy in 2014.  Past Medical History:  Diagnosis Date   Abdominal pain 06/09/2010   Arthritis    left knee   Diarrhea 06/09/2010   Flank pain 06/09/2010   Hypothyroid    Left knee pain    Rectal bleeding 06/09/2010   Vomiting 06/09/2010    Past Surgical History:  Procedure Laterality Date   COLONOSCOPY  09/19/2010   Procedure: COLONOSCOPY;  Surgeon: Claudis RAYMOND Rivet, MD;  [facility];  Service: Endoscopy;  Laterality: N/A;  10:00   COLONOSCOPY N/A 04/05/2012   Procedure: COLONOSCOPY;  Surgeon: Oneil DELENA Budge, MD;  [facility];  Service: Gastroenterology;  Laterality: N/A;   ESOPHAGEAL DILATION N/A 06/27/2014   Procedure: ESOPHAGEAL DILATION;  Surgeon: Claudis RAYMOND Rivet, MD;  [facility];  Service: Endoscopy;  Laterality: N/A;   ESOPHAGOGASTRODUODENOSCOPY N/A 06/27/2014   Procedure: ESOPHAGOGASTRODUODENOSCOPY (EGD);  Surgeon: Claudis RAYMOND Rivet, MD;  [facility];  Service: Endoscopy;  Laterality: N/A;  1255   GANGLION CYST EXCISION     left wrist   KNEE ARTHROSCOPY     left knee    Family History  Problem Relation Age of Onset   Anesthesia problems Neg Hx    Hypotension Neg Hx    Malignant hyperthermia Neg Hx    Pseudochol deficiency Neg Hx    Social History:  reports that he has never smoked. He has never used smokeless tobacco. He reports that he does not currently use alcohol. He reports that he does not use drugs.  Allergies: No Known  Allergies  Medications Prior to Admission  Medication Sig Dispense Refill   atorvastatin (LIPITOR) 10 MG tablet Take 10 mg by mouth daily.     levothyroxine (SYNTHROID, LEVOTHROID) 75 MCG tablet Take 1 tablet by mouth daily.  3   losartan (COZAAR) 50 MG tablet Take 1 tablet by mouth daily.  3   omeprazole  (PRILOSEC) 40 MG capsule TAKE 1 CAPSULE(40 MG) BY MOUTH DAILY 90 capsule 3   oxyCODONE-acetaminophen (PERCOCET) 10-325 MG tablet Take 1 tablet by mouth 3 (three) times daily.     Potassium Chloride  ER 20 MEQ TBCR Take 1 tablet by mouth daily.      No results found for this or any previous visit (from the past 48 hours). No results found.  Review of Systems  All other systems reviewed and are negative.   Blood pressure (!) 142/91, pulse 64, temperature 98.1 F (36.7 C), temperature source Oral, resp. rate 16, height 5' (1.524 m), weight 81.6 kg, SpO2 (!) 18%. Physical Exam  GENERAL: The patient is AO x3, in no acute distress. HEENT: Head is normocephalic and atraumatic. EOMI are intact. Mouth is well hydrated and without lesions. NECK: Supple. No masses LUNGS: Clear to auscultation. No presence of rhonchi/wheezing/rales. Adequate chest expansion HEART: RRR, normal s1 and s2. ABDOMEN: Soft, nontender, no guarding, no peritoneal signs, and nondistended. BS +. No masses. EXTREMITIES: Without any cyanosis, clubbing, rash, lesions or edema. NEUROLOGIC: AOx3, no focal motor deficit. SKIN: no jaundice, no rashes  Assessment/Plan  63 year old  male with past medical history GERD, Barrett's , coming for evaluation Barrett's esophagus and CRC screening.  Will proceed with EGD and colonoscopy.  Toribio Eartha Flavors, MD 09/15/2023, 7:28 AM

## 2023-09-15 NOTE — Discharge Instructions (Addendum)
You are being discharged to home.  °Resume your previous diet.  °We are waiting for your pathology results.  °Continue your present medications.  °Your physician has recommended a repeat colonoscopy for surveillance based on pathology results.  °

## 2023-09-15 NOTE — Op Note (Addendum)
 [facility] Patient Name: Frank Carr Procedure Date: 09/15/2023 7:08 AM MRN: [id number] Date of Birth: 05/03/1960 Attending MD: Toribio Fortune , , [id number] CSN: [id number] Age: 63 Admit Type: Outpatient Procedure:                Upper GI endoscopy Indications:              Follow-up of Barrett's esophagus Providers:                Toribio Fortune, Crystal Page, Bascom Blush Referring MD:              Medicines:                Monitored Anesthesia Care Complications:            No immediate complications. Estimated Blood Loss:     Estimated blood loss: none. Procedure:                Pre-Anesthesia Assessment:                           - Prior to the procedure, a History and Physical                            was performed, and patient medications, allergies                            and sensitivities were reviewed. The patient's                            tolerance of previous anesthesia was reviewed.                           - The risks and benefits of the procedure and the                            sedation options and risks were discussed with the                            patient. All questions were answered and informed                            consent was obtained.                           - ASA Grade Assessment: II - A patient with mild                            systemic disease.                           After obtaining informed consent, the endoscope was                            passed under direct vision. Throughout the                            procedure, the patient's blood  pressure, pulse, and                            oxygen saturations were monitored continuously. The                            [id number] ([id number]) Upper was introduced through                            the mouth, and advanced to the second part of                            duodenum. The upper GI endoscopy was accomplished                            without difficulty. The  patient tolerated the                            procedure well. Scope In: 7:36:53 AM Scope Out: 7:46:24 AM Total Procedure Duration: 0 hours 9 minutes 31 seconds  Findings:      The esophagus and gastroesophageal junction were examined with white       light and narrow band imaging (NBI). There were esophageal mucosal       changes suspicious for short-segment Barrett's esophagus, classified as       Barrett's stage C1-M2 per Prague criteria. These changes involved the       mucosa at the upper extent of the gastric folds (34 cm from the       incisors) extending to the Z-line (32 cm from the incisors). The maximum       longitudinal extent of these esophageal mucosal changes was 2 cm in       length. Mucosa was biopsied with a cold forceps for histology in 4       quadrants at intervals of 1 cm. A total of 2 specimen bottles were sent       to pathology.      A 3 cm hiatal hernia was present.      The stomach was normal.      The examined duodenum was normal. Impression:               - Esophageal mucosal changes suspicious for                            short-segment Barrett's esophagus, classified as                            Barrett's stage C1-M2 per Prague criteria. Biopsied.                           - 3 cm hiatal hernia.                           - Normal stomach.                           - Normal examined duodenum. Moderate Sedation:      Per Anesthesia Care Recommendation:           -  Discharge patient to home (ambulatory).                           - Resume previous diet.                           - Await pathology results.                           - Continue present medications. Procedure Code(s):        --- Professional ---                           [phone], Esophagogastroduodenoscopy, flexible,                            transoral; with biopsy, single or multiple Diagnosis Code(s):        --- Professional ---                           K22.70, Barrett's esophagus  without dysplasia                           K44.9, Diaphragmatic hernia without obstruction or                            gangrene CPT copyright 2022 [employer]. All rights reserved. The codes documented in this report are preliminary and upon coder review may  be revised to meet current compliance requirements. Toribio Fortune, MD Toribio Fortune,  09/15/2023 7:49:49 AM This report has been signed electronically. Number of Addenda: 0

## 2023-09-15 NOTE — Anesthesia Preprocedure Evaluation (Signed)
 Anesthesia Evaluation  Patient identified by MRN, date of birth, ID band Patient awake    Reviewed: Allergy & Precautions, H&P , NPO status , Patient's Chart, lab work & pertinent test results, reviewed documented beta blocker date and time   Airway Mallampati: II  TM Distance: >3 FB Neck ROM: full    Dental no notable dental hx.    Pulmonary neg pulmonary ROS   Pulmonary exam normal breath sounds clear to auscultation       Cardiovascular Exercise Tolerance: Good hypertension, negative cardio ROS  Rhythm:regular Rate:Normal     Neuro/Psych negative neurological ROS  negative psych ROS   GI/Hepatic negative GI ROS, Neg liver ROS,,,  Endo/Other  negative endocrine ROSHypothyroidism    Renal/GU negative Renal ROS  negative genitourinary   Musculoskeletal   Abdominal   Peds  Hematology negative hematology ROS (+)   Anesthesia Other Findings   Reproductive/Obstetrics negative OB ROS                             Anesthesia Physical Anesthesia Plan  ASA: 2  Anesthesia Plan: General   Post-op Pain Management:    Induction:   PONV Risk Score and Plan: Propofol infusion  Airway Management Planned:   Additional Equipment:   Intra-op Plan:   Post-operative Plan:   Informed Consent: I have reviewed the patients History and Physical, chart, labs and discussed the procedure including the risks, benefits and alternatives for the proposed anesthesia with the patient or authorized representative who has indicated his/her understanding and acceptance.     Dental Advisory Given  Plan Discussed with: CRNA  Anesthesia Plan Comments:        Anesthesia Quick Evaluation

## 2023-09-16 ENCOUNTER — Ambulatory Visit: Payer: Self-pay | Admitting: Gastroenterology

## 2023-09-16 ENCOUNTER — Encounter (INDEPENDENT_AMBULATORY_CARE_PROVIDER_SITE_OTHER): Payer: Self-pay | Admitting: *Deleted

## 2023-09-16 ENCOUNTER — Encounter (HOSPITAL_COMMUNITY): Payer: Self-pay | Admitting: Gastroenterology

## 2023-09-16 LAB — SURGICAL PATHOLOGY

## 2023-09-20 NOTE — Anesthesia Postprocedure Evaluation (Signed)
 Anesthesia Post Note  Patient: Frank Carr  Procedure(s) Performed: COLONOSCOPY EGD (ESOPHAGOGASTRODUODENOSCOPY)  Patient location during evaluation: Phase II Anesthesia Type: General Level of consciousness: awake Pain management: pain level controlled Vital Signs Assessment: post-procedure vital signs reviewed and stable Respiratory status: spontaneous breathing and respiratory function stable Cardiovascular status: blood pressure returned to baseline and stable Postop Assessment: no headache and no apparent nausea or vomiting Anesthetic complications: no Comments: Late entry   No notable events documented.   Last Vitals:  Vitals:   09/15/23 0703 09/15/23 0808  BP: (!) 142/91 (!) 99/56  Pulse: 64 61  Resp: 16 19  Temp: 36.7 C 36.5 C  SpO2: 98% 95%    Last Pain:  Vitals:   09/15/23 0810  TempSrc:   PainSc: 0-No pain                 Yvonna JINNY Bosworth

## 2023-10-04 ENCOUNTER — Encounter (INDEPENDENT_AMBULATORY_CARE_PROVIDER_SITE_OTHER): Payer: Self-pay | Admitting: *Deleted

## 2023-10-04 NOTE — Progress Notes (Signed)
 10 yr TCS noted in recall 5 yr EGD noted in recall Patient result letter mailed procedure note and pathology result faxed to PCP

## 2023-11-10 ENCOUNTER — Encounter (INDEPENDENT_AMBULATORY_CARE_PROVIDER_SITE_OTHER): Payer: Self-pay | Admitting: Gastroenterology

## 2023-11-19 DIAGNOSIS — Z125 Encounter for screening for malignant neoplasm of prostate: Secondary | ICD-10-CM | POA: Diagnosis not present

## 2023-11-19 DIAGNOSIS — E039 Hypothyroidism, unspecified: Secondary | ICD-10-CM | POA: Diagnosis not present

## 2023-11-24 DIAGNOSIS — I10 Essential (primary) hypertension: Secondary | ICD-10-CM | POA: Diagnosis not present

## 2023-11-24 DIAGNOSIS — E039 Hypothyroidism, unspecified: Secondary | ICD-10-CM | POA: Diagnosis not present

## 2023-11-24 DIAGNOSIS — K219 Gastro-esophageal reflux disease without esophagitis: Secondary | ICD-10-CM | POA: Diagnosis not present

## 2023-11-24 DIAGNOSIS — E782 Mixed hyperlipidemia: Secondary | ICD-10-CM | POA: Diagnosis not present
